# Patient Record
Sex: Female | Born: 1990 | Race: Black or African American | Hispanic: No | Smoking: Current every day smoker
Health system: Southern US, Community
[De-identification: ages and names within clinical notes are randomized; demographics above are authoritative.]

## PROBLEM LIST (undated history)

## (undated) DIAGNOSIS — N739 Female pelvic inflammatory disease, unspecified: Secondary | ICD-10-CM

## (undated) DIAGNOSIS — N83209 Unspecified ovarian cyst, unspecified side: Secondary | ICD-10-CM

## (undated) DIAGNOSIS — J45909 Unspecified asthma, uncomplicated: Secondary | ICD-10-CM

---
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
## 2008-12-09 ENCOUNTER — Other Ambulatory Visit: Admission: RE | Admit: 2008-12-09 | Discharge: 2008-12-09 | Payer: Self-pay | Admitting: Family Medicine

## 2010-07-10 NOTE — H&P (Signed)
NAMELUVERNE, Betty Roberts               ACCOUNT NO.:  [id number]   MEDICAL RECORD NO.:  [id number]          PATIENT TYPE:  INP   LOCATION:  9374                          FACILITY:  [facility]   PHYSICIAN:  Gerald Leitz, MD          DATE OF BIRTH:  06-27-1990   DATE OF ADMISSION:  11/11/2005  DATE OF DISCHARGE:                                HISTORY & PHYSICAL   HISTORY OF PRESENT ILLNESS:  This is a 20 year old G1, P0 at 15 weeks 6 days  estimated gestational age based on last menstrual period of July 23, 2005  confirmed by 6-week ultrasound with estimated date of delivery April 29, 2006.  Patient presents three-day history of vaginal bleeding. She had an  ultrasound in the office that showed fetal demise, crown-rump length of 11  weeks and 3 days with prominent spine, no fetal heart rate was seen. No  amniotic fluid in the gestational sac.  Placenta is posterior.  She denies  any abdominal pain.   PAST MEDICAL HISTORY:  Asthma.   PAST SURGICAL HISTORY:  None.   PAST GYN HISTORY:  Trichomonas treated in 2005.  She had a Pap smear in  December of 2006 and this was normal.   FAMILY HISTORY:  Noncontributory.   PAST OB HISTORY:  Current.   MEDICATIONS:  Prenatal vitamins.   ALLERGIES:  NO KNOWN DRUG ALLERGIES.   SOCIAL HISTORY:  The patient is single.  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  Denies tobacco, alcohol or illicit  drug use.   REVIEW OF SYSTEMS:  Negative except as stated in history of current illness.   PHYSICAL EXAMINATION:  VITAL SIGNS:  Blood pressure 110/60, weight 127  pounds.  CARDIOVASCULAR:  Regular rate and rhythm.  LUNGS:  Clear to auscultation bilaterally.  ABDOMEN:  Soft, nontender, nondistended, no masses.  EXTREMITIES:  No cyanosis, clubbing or edema, 2+ reflexes.  PELVIC:  Normal external female genitalia, no vulvar, vaginal or cervical  lesions noted.  No blood in the vaginal vault.  The cervix is closed.  No  cervical motion  tenderness.  Uterus seems approximately 12 to 14 weeks size.  No adnexal masses or tenderness.   IMPRESSION AND PLAN:  Second trimester fetal demise.  Recommend Cytotec  induction.  The patient is scheduled at [facility] on November 11, 2005.   PLAN:  Cytotec 800 mcg per vagina every eight hours until delivery.  Morphine sulfate for pain control.      Gerald Leitz, MD  Electronically Signed     TC/MEDQ  D:  11/10/2005  T:  11/11/2005  Job:  [id number]

## 2010-07-10 NOTE — Discharge Summary (Signed)
NAMEBRINLY, Betty Roberts               ACCOUNT NO.:  [id number]   MEDICAL RECORD NO.:  [id number]          PATIENT TYPE:  INP   LOCATION:  9316                          [facility]ILITY:  [facility]   PHYSICIAN:  Gerald Leitz, MD          DATE OF BIRTH:  1990-12-12   DATE OF ADMISSION:  11/11/2005  DATE OF DISCHARGE:  11/12/2005                                 DISCHARGE SUMMARY   REASON FOR ADMISSION:  Second trimester fetal demise for Cytotec induction.   BRIEF HOSPITAL COURSE:  This is a 20 year old G1 who presented to my office  on September 19 at 16 weeks estimated gestational age with a fetal demise.  She was admitted to maternity ICU for delivery, received Cytotec 800 mcg per  vagina, hemoglobin on admission was 12.9.  The patient had minimal bleeding  during the procedure, did well, vital signs remained stable.  On postpartum  day one, she denied any pain, no heavy bleeding, decreased lochia.  She was  discharged  home in improved and stable condition.  Diet will be regular.  She will follow up with Dr. Richardson Dopp in two weeks.  Medications at discharge are  Lotrin and Methergine.  Please note that the patient delivered on September  20 at approximately 11:45, placenta delivered at approximately 1630 on  September 20.  The patient did well postpartum.      Gerald Leitz, MD  Electronically Signed     TC/MEDQ  D:  11/12/2005  T:  11/14/2005  Job:  [id number]

## 2010-11-13 LAB — I-STAT 8, (EC8 V) (CONVERTED LAB)
Acid-base deficit: 2
BUN: 17
Bicarbonate: 23.7
Chloride: 107
Glucose, Bld: 108 — ABNORMAL HIGH
HCT: 47 — ABNORMAL HIGH
Hemoglobin: 16 — ABNORMAL HIGH
Operator id: 288331
Potassium: 4.2
Sodium: 139
TCO2: 25
pCO2, Ven: 41.6 — ABNORMAL LOW
pH, Ven: 7.364 — ABNORMAL HIGH

## 2010-11-13 LAB — URINALYSIS, ROUTINE W REFLEX MICROSCOPIC
Bilirubin Urine: NEGATIVE
Leukocytes, UA: NEGATIVE
Nitrite: NEGATIVE
Specific Gravity, Urine: 1.024
pH: 6

## 2010-11-13 LAB — POCT I-STAT CREATININE
Creatinine, Ser: 1.1
Operator id: 288331

## 2010-11-13 LAB — URINE MICROSCOPIC-ADD ON

## 2011-12-03 ENCOUNTER — Other Ambulatory Visit (HOSPITAL_COMMUNITY)
Admission: RE | Admit: 2011-12-03 | Discharge: 2011-12-03 | Disposition: A | Discharge status: Home / Self Care | Payer: Self-pay | Source: Ambulatory Visit | Attending: Family Medicine | Admitting: Family Medicine

## 2011-12-03 ENCOUNTER — Other Ambulatory Visit: Payer: Self-pay | Admitting: Physician Assistant

## 2011-12-03 DIAGNOSIS — Z01419 Encounter for gynecological examination (general) (routine) without abnormal findings: Secondary | ICD-10-CM | POA: Insufficient documentation

## 2013-08-01 ENCOUNTER — Encounter (HOSPITAL_BASED_OUTPATIENT_CLINIC_OR_DEPARTMENT_OTHER): Payer: Self-pay | Admitting: Emergency Medicine

## 2013-08-01 ENCOUNTER — Inpatient Hospital Stay (HOSPITAL_BASED_OUTPATIENT_CLINIC_OR_DEPARTMENT_OTHER)
Admission: EM | Admit: 2013-08-01 | Discharge: 2013-08-02 | Disposition: A | Discharge status: Other Acute Inpatient Hospital | Payer: BC Managed Care – PPO | Attending: Obstetrics and Gynecology | Admitting: Obstetrics and Gynecology

## 2013-08-01 DIAGNOSIS — J45909 Unspecified asthma, uncomplicated: Secondary | ICD-10-CM | POA: Insufficient documentation

## 2013-08-01 DIAGNOSIS — Z3202 Encounter for pregnancy test, result negative: Secondary | ICD-10-CM | POA: Insufficient documentation

## 2013-08-01 DIAGNOSIS — N730 Acute parametritis and pelvic cellulitis: Secondary | ICD-10-CM

## 2013-08-01 DIAGNOSIS — N9489 Other specified conditions associated with female genital organs and menstrual cycle: Secondary | ICD-10-CM

## 2013-08-01 DIAGNOSIS — F17200 Nicotine dependence, unspecified, uncomplicated: Secondary | ICD-10-CM | POA: Insufficient documentation

## 2013-08-01 DIAGNOSIS — R110 Nausea: Secondary | ICD-10-CM | POA: Insufficient documentation

## 2013-08-01 HISTORY — DX: Unspecified asthma, uncomplicated: J45.909

## 2013-08-01 LAB — LIPASE, BLOOD: LIPASE: 12 U/L (ref 11–59)

## 2013-08-01 LAB — WET PREP, GENITAL
Clue Cells Wet Prep HPF POC: NONE SEEN
TRICH WET PREP: NONE SEEN
Yeast Wet Prep HPF POC: NONE SEEN

## 2013-08-01 LAB — CBC WITH DIFFERENTIAL/PLATELET
BASOS ABS: 0 10*3/uL (ref 0.0–0.1)
Basophils Relative: 0 % (ref 0–1)
Eosinophils Absolute: 0 10*3/uL (ref 0.0–0.7)
Eosinophils Relative: 0 % (ref 0–5)
HCT: 35.4 % — ABNORMAL LOW (ref 36.0–46.0)
Hemoglobin: 12.5 g/dL (ref 12.0–15.0)
LYMPHS ABS: 1.3 10*3/uL (ref 0.7–4.0)
LYMPHS PCT: 12 % (ref 12–46)
MCH: 33.1 pg (ref 26.0–34.0)
MCHC: 35.3 g/dL (ref 30.0–36.0)
MCV: 93.7 fL (ref 78.0–100.0)
Monocytes Absolute: 0.7 10*3/uL (ref 0.1–1.0)
Monocytes Relative: 7 % (ref 3–12)
NEUTROS ABS: 9.1 10*3/uL — AB (ref 1.7–7.7)
NEUTROS PCT: 82 % — AB (ref 43–77)
PLATELETS: 200 10*3/uL (ref 150–400)
RBC: 3.78 MIL/uL — AB (ref 3.87–5.11)
RDW: 12.3 % (ref 11.5–15.5)
WBC: 11.2 10*3/uL — AB (ref 4.0–10.5)

## 2013-08-01 LAB — COMPREHENSIVE METABOLIC PANEL
ALBUMIN: 4.3 g/dL (ref 3.5–5.2)
ALK PHOS: 67 U/L (ref 39–117)
ALT: 7 U/L (ref 0–35)
AST: 15 U/L (ref 0–37)
BILIRUBIN TOTAL: 1.1 mg/dL (ref 0.3–1.2)
BUN: 19 mg/dL (ref 6–23)
CHLORIDE: 104 meq/L (ref 96–112)
CO2: 25 meq/L (ref 19–32)
CREATININE: 0.7 mg/dL (ref 0.50–1.10)
Calcium: 9.6 mg/dL (ref 8.4–10.5)
GFR calc Af Amer: >90 mL/min (ref >90)
GFR calc non Af Amer: >90 mL/min (ref >90)
Glucose, Bld: 110 mg/dL — ABNORMAL HIGH (ref 70–99)
POTASSIUM: 3.5 meq/L — AB (ref 3.7–5.3)
SODIUM: 141 meq/L (ref 137–147)
Total Protein: 7 g/dL (ref 6.0–8.3)

## 2013-08-01 LAB — URINALYSIS, ROUTINE W REFLEX MICROSCOPIC
Glucose, UA: NEGATIVE mg/dL
HGB URINE DIPSTICK: NEGATIVE
Ketones, ur: >80 mg/dL — AB
Leukocytes, UA: NEGATIVE
NITRITE: NEGATIVE
PH: 6 (ref 5.0–8.0)
Protein, ur: NEGATIVE mg/dL
SPECIFIC GRAVITY, URINE: 1.038 — AB (ref 1.005–1.030)
UROBILINOGEN UA: 1 mg/dL (ref 0.0–1.0)

## 2013-08-01 LAB — PREGNANCY, URINE: PREG TEST UR: NEGATIVE

## 2013-08-01 MED ORDER — SODIUM CHLORIDE 0.9 % IV BOLUS (SEPSIS)
1000.0000 mL | Freq: Once | INTRAVENOUS | Status: AC
  Administered 2013-08-01: 1000 mL via INTRAVENOUS

## 2013-08-01 MED ORDER — ONDANSETRON HCL 4 MG/2ML IJ SOLN
4.0000 mg | Freq: Once | INTRAMUSCULAR | Status: AC
  Administered 2013-08-01: 4 mg via INTRAVENOUS
  Filled 2013-08-01: qty 2

## 2013-08-01 MED ORDER — FENTANYL CITRATE 0.05 MG/ML IJ SOLN
50.0000 ug | Freq: Once | INTRAMUSCULAR | Status: AC
  Administered 2013-08-01: 50 ug via INTRAVENOUS
  Filled 2013-08-01: qty 2

## 2013-08-01 MED ORDER — SODIUM CHLORIDE 0.9 % IV SOLN: INTRAVENOUS | Status: DC

## 2013-08-01 NOTE — ED Provider Notes (Addendum)
CSN: [id number]     Arrival date & time 08/01/13  2035 History  This chart was scribed for Hanley Seamen, MD by Evon Slack, ED Scribe. This patient was seen in room MH04/MH04 and the patient's care was started at 10:55 PM.    Chief Complaint  Patient presents with  . Abdominal Pain   Patient is a 23 y.o. female presenting with abdominal pain. The history is provided by the patient. No language interpreter was used.  Abdominal Pain Associated symptoms: nausea   Associated symptoms: no chills, no diarrhea, no dysuria, no fever, no vaginal discharge and no vomiting   HPI Comments: Betty Roberts is a 23 y.o. female who presents to the Emergency Department complaining of abdominal pain onset 8AM today. She states that the pain is cramping and diffuse and has been radiating into her chest area. The pain waxes and wanes but is never gone. It is worse with movement or palpation. It is moderate to severe at its worst. She has associated nausea. She states that she has been taking tylenol with no relief of her symptoms. She denies fever, chills, vomiting, dysuria, vaginal discharge, or vaginal pain.    Past Medical History  Diagnosis Date  . Asthma    History reviewed. No pertinent past surgical history. History reviewed. No pertinent family history. History  Substance Use Topics  . Smoking status: Current Every Day Smoker    Types: Cigars  . Smokeless tobacco: Not on file  . Alcohol Use: No   OB History   Grav Para Term Preterm Abortions TAB SAB Ect Mult Living                 Review of Systems  Constitutional: Negative for fever and chills.  Gastrointestinal: Positive for nausea and abdominal pain. Negative for vomiting and diarrhea.  Genitourinary: Negative for dysuria, vaginal discharge and vaginal pain.   Allergies  Review of patient's allergies indicates no known allergies.  Home Medications   Prior to Admission medications   Not on File   Triage Vitals: BP 106/65   Pulse 110  Temp(Src) 98.2 F (36.8 C) (Oral)  Resp 16  Ht 5\' 10"  (1.778 m)  Wt 117 lb (53.071 kg)  BMI 16.79 kg/m2  SpO2 100%  LMP 07/11/2013  Physical Exam  Nursing note and vitals reviewed. General: Well-developed, well-nourished female in no acute distress; appearance consistent with age of record HENT: normocephalic; atraumatic Eyes: pupils equal, round and reactive to light; extraocular muscles intact Neck: supple Heart: regular rate and rhythm; no murmurs, rubs or gallops Lungs: clear to auscultation bilaterally Abdomen: soft; nondistended; diffusely tender; no masses or hepatosplenomegaly; bowel sounds present GU: No CVA tenderness; normal external genitalia; physiologic appearing vaginal discharge; no vaginal bleeding; no cervical erythema; mild cervical motion tenderness; mild bilateral adnexal tenderness Extremities: No deformity; full range of motion; pulses normal Neurologic: Awake, alert and oriented; motor function intact in all extremities and symmetric; no facial droop Skin: Warm and dry Psychiatric: Normal mood and affect   ED Course  Procedures (including critical care time) DIAGNOSTIC STUDIES: Oxygen Saturation is 100% on RA, normal by my interpretation.    COORDINATION OF CARE: 11:02 PM-Discussed treatment plan which includes CBC panel, CMP, and UA with pt at bedside and pt agreed to plan.     MDM   Nursing notes and vitals signs, including pulse oximetry, reviewed.  Summary of this visit's results, reviewed by myself:  Labs:  Results for orders placed during the hospital  encounter of 08/01/13 (from the past 24 hour(s))  URINALYSIS, ROUTINE W REFLEX MICROSCOPIC     Status: Abnormal   Collection Time    08/01/13  8:52 PM      Result Value Ref Range   Color, Urine YELLOW  YELLOW   APPearance CLOUDY (*) CLEAR   Specific Gravity, Urine 1.038 (*) 1.005 - 1.030   pH 6.0  5.0 - 8.0   Glucose, UA NEGATIVE  NEGATIVE mg/dL   Hgb urine dipstick NEGATIVE   NEGATIVE   Bilirubin Urine SMALL (*) NEGATIVE   Ketones, ur >80 (*) NEGATIVE mg/dL   Protein, ur NEGATIVE  NEGATIVE mg/dL   Urobilinogen, UA 1.0  0.0 - 1.0 mg/dL   Nitrite NEGATIVE  NEGATIVE   Leukocytes, UA NEGATIVE  NEGATIVE  PREGNANCY, URINE     Status: None   Collection Time    08/01/13  8:52 PM      Result Value Ref Range   Preg Test, Ur NEGATIVE  NEGATIVE  CBC WITH DIFFERENTIAL     Status: Abnormal   Collection Time    08/01/13 11:16 PM      Result Value Ref Range   WBC 11.2 (*) 4.0 - 10.5 K/uL   RBC 3.78 (*) 3.87 - 5.11 MIL/uL   Hemoglobin 12.5  12.0 - 15.0 g/dL   HCT 16.135.4 (*) 09.636.0 - 04.546.0 %   MCV 93.7  78.0 - 100.0 fL   MCH 33.1  26.0 - 34.0 pg   MCHC 35.3  30.0 - 36.0 g/dL   RDW 40.912.3  81.111.5 - 91.415.5 %   Platelets 200  150 - 400 K/uL   Neutrophils Relative % 82 (*) 43 - 77 %   Neutro Abs 9.1 (*) 1.7 - 7.7 K/uL   Lymphocytes Relative 12  12 - 46 %   Lymphs Abs 1.3  0.7 - 4.0 K/uL   Monocytes Relative 7  3 - 12 %   Monocytes Absolute 0.7  0.1 - 1.0 K/uL   Eosinophils Relative 0  0 - 5 %   Eosinophils Absolute 0.0  0.0 - 0.7 K/uL   Basophils Relative 0  0 - 1 %   Basophils Absolute 0.0  0.0 - 0.1 K/uL  LIPASE, BLOOD     Status: None   Collection Time    08/01/13 11:16 PM      Result Value Ref Range   Lipase 12  11 - 59 U/L  COMPREHENSIVE METABOLIC PANEL     Status: Abnormal   Collection Time    08/01/13 11:16 PM      Result Value Ref Range   Sodium 141  137 - 147 mEq/L   Potassium 3.5 (*) 3.7 - 5.3 mEq/L   Chloride 104  96 - 112 mEq/L   CO2 25  19 - 32 mEq/L   Glucose, Bld 110 (*) 70 - 99 mg/dL   BUN 19  6 - 23 mg/dL   Creatinine, Ser 7.820.70  0.50 - 1.10 mg/dL   Calcium 9.6  8.4 - 95.610.5 mg/dL   Total Protein 7.0  6.0 - 8.3 g/dL   Albumin 4.3  3.5 - 5.2 g/dL   AST 15  0 - 37 U/L   ALT 7  0 - 35 U/L   Alkaline Phosphatase 67  39 - 117 U/L   Total Bilirubin 1.1  0.3 - 1.2 mg/dL   GFR calc non Af Amer >90  >90 mL/min   GFR calc Af Amer >90  >90 mL/min  WET PREP,  GENITAL     Status: Abnormal   Collection Time    08/01/13 11:35 PM      Result Value Ref Range   Yeast Wet Prep HPF POC NONE SEEN  NONE SEEN   Trich, Wet Prep NONE SEEN  NONE SEEN   Clue Cells Wet Prep HPF POC NONE SEEN  NONE SEEN   WBC, Wet Prep HPF POC FEW (*) NONE SEEN    Imaging Studies: Ct Abdomen Pelvis W Contrast  08/02/2013   CLINICAL DATA:  Stabbing abdominal pain, pelvic pain, nausea.  EXAM: CT ABDOMEN AND PELVIS WITH CONTRAST  TECHNIQUE: Multidetector CT imaging of the abdomen and pelvis was performed using the standard protocol following bolus administration of intravenous contrast.  CONTRAST:  50mL OMNIPAQUE IOHEXOL 300 MG/ML SOLN, OMNIPAQUE IOHEXOL 300 MG/ML SOLN  COMPARISON:  None.  FINDINGS: There is diffuse free fluid throughout the abdomen and pelvis. Increased Hounsfield unit measurements suggest complex fluid. This could represent hemorrhage or infected fluid. Probable heterogeneous soft tissue mass with involuting follicle in the right ovary. Soft tissue component appears to measure about 5.6 x 5.3 cm. Consider ectopic pregnancy versus is ruptured ovarian mass. Ultrasound suggested for further evaluation. Correlation with pregnancy test is suggested if not already obtained.  The liver, spleen, gallbladder, pancreas, adrenal glands, kidneys, abdominal aorta, inferior vena cava, and retroperitoneal lymph nodes are unremarkable. Stomach and small bowel are not abnormally distended. Stool-filled colon without distention. No free air in the abdomen.  Pelvis: Bladder wall is not thickened. Uterus does not appear enlarged. Left ovary is not enlarged. Appendix is not identified. No destructive bone lesions appreciated.  IMPRESSION: Diffuse free fluid throughout the abdomen and pelvis suggesting hemorrhage. There appears to be a large soft tissue mass in the right adnexa with additional involuting follicle. Consider rupturing ovarian mass versus ectopic pregnancy. Ultrasound  recommended.  Results discussed with Dr. Read Drivers at 0208 hours on 08/02/2013.   Electronically Signed   By: Burman Nieves M.D.   On: 08/02/2013 02:11   2:35 AM Discussed with nurse midwife Sharen Counter at [facility] MAU who will see patient. Patient has been stable and has been informed of the CT findings. She is requesting some additional pain medication prior to transfer.  I personally performed the services described in this documentation, which was scribed in my presence. The recorded information has been reviewed and is accurate.    Hanley Seamen, MD 08/02/13 0235  Hanley Seamen, MD 08/02/13 [phone]

## 2013-08-01 NOTE — ED Notes (Signed)
C/o lower abd pain w some nausea x 1 day,  Denies urinary sx

## 2013-08-01 NOTE — ED Notes (Signed)
Pt c/o lower abd pain with nausea only x 1 day

## 2013-08-02 ENCOUNTER — Emergency Department (HOSPITAL_BASED_OUTPATIENT_CLINIC_OR_DEPARTMENT_OTHER): Payer: BC Managed Care – PPO

## 2013-08-02 ENCOUNTER — Inpatient Hospital Stay (HOSPITAL_COMMUNITY): Payer: BC Managed Care – PPO

## 2013-08-02 ENCOUNTER — Encounter (HOSPITAL_COMMUNITY): Payer: Self-pay | Admitting: *Deleted

## 2013-08-02 DIAGNOSIS — N9489 Other specified conditions associated with female genital organs and menstrual cycle: Secondary | ICD-10-CM

## 2013-08-02 LAB — GC/CHLAMYDIA PROBE AMP
CT PROBE, AMP APTIMA: NEGATIVE
GC PROBE AMP APTIMA: NEGATIVE

## 2013-08-02 MED ORDER — DOXYCYCLINE HYCLATE 100 MG PO CAPS: 100.0000 mg | ORAL_CAPSULE | Freq: Two times a day (BID) | ORAL | Status: DC

## 2013-08-02 MED ORDER — OXYCODONE-ACETAMINOPHEN 5-325 MG PO TABS: 1.0000 | ORAL_TABLET | Freq: Four times a day (QID) | ORAL | Status: DC | PRN

## 2013-08-02 MED ORDER — IOHEXOL 300 MG/ML  SOLN
50.0000 mL | Freq: Once | INTRAMUSCULAR | Status: AC | PRN
  Administered 2013-08-02: 50 mL via ORAL

## 2013-08-02 MED ORDER — OXYCODONE-ACETAMINOPHEN 5-325 MG PO TABS
2.0000 | ORAL_TABLET | ORAL | Status: AC
  Administered 2013-08-02: 2 via ORAL
  Filled 2013-08-02: qty 2

## 2013-08-02 MED ORDER — FENTANYL CITRATE 0.05 MG/ML IJ SOLN
50.0000 ug | Freq: Once | INTRAMUSCULAR | Status: AC
  Administered 2013-08-02: 50 ug via INTRAVENOUS
  Filled 2013-08-02: qty 2

## 2013-08-02 MED ORDER — CEFTRIAXONE SODIUM 250 MG IJ SOLR
250.0000 mg | INTRAMUSCULAR | Status: AC
  Administered 2013-08-02: 250 mg via INTRAMUSCULAR
  Filled 2013-08-02: qty 250

## 2013-08-02 MED ORDER — IOHEXOL 300 MG/ML  SOLN
100.0000 mL | Freq: Once | INTRAMUSCULAR | Status: AC | PRN
  Administered 2013-08-02: 100 mL via INTRAVENOUS

## 2013-08-02 MED ORDER — AZITHROMYCIN 250 MG PO TABS
1000.0000 mg | ORAL_TABLET | ORAL | Status: AC
  Administered 2013-08-02: 1000 mg via ORAL
  Filled 2013-08-02: qty 4

## 2013-08-02 NOTE — ED Notes (Signed)
Per md pt [facility]  By pov w parent for [place],  Iv intact and secure

## 2013-08-02 NOTE — MAU Provider Note (Signed)
Attestation of Attending Supervision of Advanced Practitioner: Evaluation and management procedures were performed by the PA/NP/CNM/OB Fellow under my supervision/collaboration. Chart reviewed and agree with management and plan.  Arieh Bogue V 08/02/2013 12:11 PM

## 2013-08-02 NOTE — MAU Provider Note (Signed)
Chief Complaint: Abdominal Pain   None    SUBJECTIVE HPI: Betty Roberts is a 23 y.o. who presents to maternity admissions sent from [facility] for diffuse abdominal pain.  She reports onset of abdominal cramping this morning when she was getting ready for work.  The pain worsened over the next few hours and she went to [facility] to be evaluated.  Her CT scan today showed a pelvic mass and complex fluid in her abdomen.  She was sent to MAU for pelvic ultrasound.  She denies vaginal bleeding, vaginal itching/burning, urinary symptoms, h/a, dizziness, n/v, or fever/chills.     Past Medical History  Diagnosis Date  . Asthma    History reviewed. No pertinent past surgical history. History   Social History  . Marital Status: Single    Spouse Name: N/A    Number of Children: N/A  . Years of Education: N/A   Occupational History  . Not on file.   Social History Main Topics  . Smoking status: Current Every Day Smoker    Types: Cigars  . Smokeless tobacco: Not on file  . Alcohol Use: No  . Drug Use: No  . Sexual Activity: Yes    Birth Control/ Protection: None   Other Topics Concern  . Not on file   Social History Narrative  . No narrative on file   No current facility-administered medications on file prior to encounter.   No current outpatient prescriptions on file prior to encounter.   No Known Allergies  ROS: Pertinent items in HPI  OBJECTIVE Blood pressure 116/59, pulse 83, temperature 98.3 F (36.8 C), temperature source Oral, resp. rate 18, height 5' 10.6" (1.793 m), weight 55.339 kg (122 lb), last menstrual period 07/10/2013, SpO2 100.00%. GENERAL: Well-developed, well-nourished female in no acute distress.  HEENT: Normocephalic HEART: normal rate RESP: normal effort ABDOMEN: Soft, non-tender EXTREMITIES: Nontender, no edema NEURO: Alert and oriented SPECULUM EXAM: Deferred--done at [facility] with cultures sent  LAB  RESULTS Results for orders placed during the hospital encounter of 08/01/13 (from the past 24 hour(s))  URINALYSIS, ROUTINE W REFLEX MICROSCOPIC     Status: Abnormal   Collection Time    08/01/13  8:52 PM      Result Value Ref Range   Color, Urine YELLOW  YELLOW   APPearance CLOUDY (*) CLEAR   Specific Gravity, Urine 1.038 (*) 1.005 - 1.030   pH 6.0  5.0 - 8.0   Glucose, UA NEGATIVE  NEGATIVE mg/dL   Hgb urine dipstick NEGATIVE  NEGATIVE   Bilirubin Urine SMALL (*) NEGATIVE   Ketones, ur >80 (*) NEGATIVE mg/dL   Protein, ur NEGATIVE  NEGATIVE mg/dL   Urobilinogen, UA 1.0  0.0 - 1.0 mg/dL   Nitrite NEGATIVE  NEGATIVE   Leukocytes, UA NEGATIVE  NEGATIVE  PREGNANCY, URINE     Status: None   Collection Time    08/01/13  8:52 PM      Result Value Ref Range   Preg Test, Ur NEGATIVE  NEGATIVE  CBC WITH DIFFERENTIAL     Status: Abnormal   Collection Time    08/01/13 11:16 PM      Result Value Ref Range   WBC 11.2 (*) 4.0 - 10.5 K/uL   RBC 3.78 (*) 3.87 - 5.11 MIL/uL   Hemoglobin 12.5  12.0 - 15.0 g/dL   HCT 75.8 (*) 83.2 - 54.9 %   MCV 93.7  78.0 - 100.0 fL   MCH 33.1  26.0 - 34.0 pg   MCHC 35.3  30.0 - 36.0 g/dL   RDW 16.1  09.6 - 04.5 %   Platelets 200  150 - 400 K/uL   Neutrophils Relative % 82 (*) 43 - 77 %   Neutro Abs 9.1 (*) 1.7 - 7.7 K/uL   Lymphocytes Relative 12  12 - 46 %   Lymphs Abs 1.3  0.7 - 4.0 K/uL   Monocytes Relative 7  3 - 12 %   Monocytes Absolute 0.7  0.1 - 1.0 K/uL   Eosinophils Relative 0  0 - 5 %   Eosinophils Absolute 0.0  0.0 - 0.7 K/uL   Basophils Relative 0  0 - 1 %   Basophils Absolute 0.0  0.0 - 0.1 K/uL  LIPASE, BLOOD     Status: None   Collection Time    08/01/13 11:16 PM      Result Value Ref Range   Lipase 12  11 - 59 U/L  COMPREHENSIVE METABOLIC PANEL     Status: Abnormal   Collection Time    08/01/13 11:16 PM      Result Value Ref Range   Sodium 141  137 - 147 mEq/L   Potassium 3.5 (*) 3.7 - 5.3 mEq/L   Chloride 104  96 - 112 mEq/L    CO2 25  19 - 32 mEq/L   Glucose, Bld 110 (*) 70 - 99 mg/dL   BUN 19  6 - 23 mg/dL   Creatinine, Ser 4.09  0.50 - 1.10 mg/dL   Calcium 9.6  8.4 - 81.1 mg/dL   Total Protein 7.0  6.0 - 8.3 g/dL   Albumin 4.3  3.5 - 5.2 g/dL   AST 15  0 - 37 U/L   ALT 7  0 - 35 U/L   Alkaline Phosphatase 67  39 - 117 U/L   Total Bilirubin 1.1  0.3 - 1.2 mg/dL   GFR calc non Af Amer >90  >90 mL/min   GFR calc Af Amer >90  >90 mL/min  WET PREP, GENITAL     Status: Abnormal   Collection Time    08/01/13 11:35 PM      Result Value Ref Range   Yeast Wet Prep HPF POC NONE SEEN  NONE SEEN   Trich, Wet Prep NONE SEEN  NONE SEEN   Clue Cells Wet Prep HPF POC NONE SEEN  NONE SEEN   WBC, Wet Prep HPF POC FEW (*) NONE SEEN    IMAGING  US Transvaginal Non-ob  08/02/2013   CLINICAL DATA:  Diffuse abdominal pain. Cystic mass and fluid in the pelvis on CT.  EXAM: TRANSABDOMINAL AND TRANSVAGINAL ULTRASOUND OF PELVIS  TECHNIQUE: Both transabdominal and transvaginal ultrasound examinations of the pelvis were performed. Transabdominal technique was performed for global imaging of the pelvis including uterus, ovaries, adnexal regions, and pelvic cul-de-sac. It was necessary to proceed with endovaginal exam following the transabdominal exam to visualize the uterus and ovaries.  COMPARISON:  CT abdomen and pelvis 08/02/2013  FINDINGS: Uterus  Measurements: 8.8 x 4.9 x 5 cm, retroverted. No fibroids or other mass visualized.  Endometrium  Thickness: 11 mm.  No focal abnormality visualized.  Right ovary  Measurements: 5.8 x 3.2 x 3.2 cm. Normal appearance to the right ovary. In the right adnexal region, there is complex appearing solid and cystic mass measuring 5.4 x 4.9 x 4.3 cm. This may be related to the tubes.  Left ovary  Measurements: 5 x 3.3 x  2.4 cm. Normal appearance to the left ovary. Adjacent to the left ovary, there is a complex cystic and solid mass measuring 4.6 x 2.3 x 2.3 cm. This may be related to the fallopian  tubes.  Other findings  Large amount of free fluid in the pelvis.  IMPRESSION: Large amount of free fluid in the pelvis. The ovaries appear normal but in the adnexal regions bilaterally, complex mass lesions are identified which may be affiliated with the fallopian tubes. Consider endometriosis or pelvic inflammatory disease. Atypical hemorrhagic cyst would be less likely. Short-term follow-up is suggested.   Electronically Signed   By: Burman NievesWilliam  Stevens M.D.   On: 08/02/2013 04:12   Ct Abdomen Pelvis W Contrast  08/02/2013   CLINICAL DATA:  Stabbing abdominal pain, pelvic pain, nausea.  EXAM: CT ABDOMEN AND PELVIS WITH CONTRAST  TECHNIQUE: Multidetector CT imaging of the abdomen and pelvis was performed using the standard protocol following bolus administration of intravenous contrast.  CONTRAST:  50mL OMNIPAQUE IOHEXOL 300 MG/ML SOLN, 100mL OMNIPAQUE IOHEXOL 300 MG/ML SOLN  COMPARISON:  None.  FINDINGS: There is diffuse free fluid throughout the abdomen and pelvis. Increased Hounsfield unit measurements suggest complex fluid. This could represent hemorrhage or infected fluid. Probable heterogeneous soft tissue mass with involuting follicle in the right ovary. Soft tissue component appears to measure about 5.6 x 5.3 cm. Consider ectopic pregnancy versus is ruptured ovarian mass. Ultrasound suggested for further evaluation. Correlation with pregnancy test is suggested if not already obtained.  The liver, spleen, gallbladder, pancreas, adrenal glands, kidneys, abdominal aorta, inferior vena cava, and retroperitoneal lymph nodes are unremarkable. Stomach and small bowel are not abnormally distended. Stool-filled colon without distention. No free air in the abdomen.  Pelvis: Bladder wall is not thickened. Uterus does not appear enlarged. Left ovary is not enlarged. Appendix is not identified. No destructive bone lesions appreciated.  IMPRESSION: Diffuse free fluid throughout the abdomen and pelvis suggesting  hemorrhage. There appears to be a large soft tissue mass in the right adnexa with additional involuting follicle. Consider rupturing ovarian mass versus ectopic pregnancy. Ultrasound recommended.  Results discussed with Dr. Read DriversMolpus at 0208 hours on 08/02/2013.   Electronically Signed   By: Burman NievesWilliam  Stevens M.D.   On: 08/02/2013 02:11    ASSESSMENT 1. Adnexal mass   2. PID (acute pelvic inflammatory disease)     PLAN Rocephin 250 mg IM, azithromycin 1g PO, Percocet 5/325 x2 tabs in MAU Discharge home Doxycycline 100 mg BID x14 days Percocet 5/325, take 1-2 tabs Q 6 hours PRN x 20 tabs F/U appointment next week in [facility]   Follow-up Information   [facility] (On Monday, 08/06/13, at 3:00 pm. )    Specialty:  Obstetrics and Gynecology   Contact information:   [address] [place] [address] [phone]      Follow up with [facility]. (As needed for emergencies)    Contact information:   [address] [id number] [place] [address] [phone]      Sharen CounterLisa Leftwich-Kirby Certified Nurse-Midwife 08/02/2013  6:04 AM

## 2013-08-02 NOTE — Discharge Instructions (Signed)
Pelvic Inflammatory Disease  Pelvic inflammatory disease (PID) refers to an infection in some or all of the female organs. The infection can be in the uterus, ovaries, fallopian tubes, or the surrounding tissues in the pelvis. PID can cause abdominal or pelvic pain that comes on suddenly (acute pelvic pain). PID is a serious infection because it can lead to lasting (chronic) pelvic pain or the inability to have children (infertile).   CAUSES   The infection is often caused by the normal bacteria found in the vaginal tissues. PID may also be caused by an infection that is spread during sexual contact. PID can also occur following:   · The birth of a baby.    · A miscarriage.    · An abortion.    · Major pelvic surgery.    · The use of an intrauterine device (IUD).    · A sexual assault.    RISK FACTORS  Certain factors can put a person at higher risk for PID, such as:  · Being younger than 25 years.  · Being sexually active at a young age.  · Using nonbarrier contraception.  · Having multiple sexual partners.  · Having sex with someone who has symptoms of a genital infection.  · Using oral contraception.  Other times, certain behaviors can increase the possibility of getting PID, such as:  · Having sex during your period.  · Using a vaginal douche.  · Having an intrauterine device (IUD) in place.  SYMPTOMS   · Abdominal or pelvic pain.    · Fever.    · Chills.    · Abnormal vaginal discharge.  · Abnormal uterine bleeding.    · Unusual pain shortly after finishing your period.  DIAGNOSIS   Your caregiver will choose some of the following methods to make a diagnosis, such as:   · Performing a physical exam and history. A pelvic exam typically reveals a very tender uterus and surrounding pelvis.    · Ordering laboratory tests including a pregnancy test, blood tests, and urine test.   · Ordering cultures of the vagina and cervix to check for a sexually transmitted infection (STI).  · Performing an ultrasound.     · Performing a laparoscopic procedure to look inside the pelvis.    TREATMENT   · Antibiotic medicines may be prescribed and taken by mouth.    · Sexual partners may be treated when the infection is caused by a sexually transmitted disease (STD).    · Hospitalization may be needed to give antibiotics intravenously.  · Surgery may be needed, but this is rare.  It may take weeks until you are completely well. If you are diagnosed with PID, you should also be checked for human immunodeficiency virus (HIV).    HOME CARE INSTRUCTIONS   · If given, take your antibiotics as directed. Finish the medicine even if you start to feel better.    · Only take over-the-counter or prescription medicines for pain, discomfort, or fever as directed by your caregiver.    · Do not have sexual intercourse until treatment is completed or as directed by your caregiver. If PID is confirmed, your recent sexual partner(s) will need treatment.    · Keep your follow-up appointments.  SEEK MEDICAL CARE IF:   · You have increased or abnormal vaginal discharge.    · You need prescription medicine for your pain.    · You vomit.    · You cannot take your medicines.    · Your partner has an STD.    SEEK IMMEDIATE MEDICAL CARE IF:   · You have a fever.    · You have increased abdominal or   pelvic pain.    · You have chills.    · You have pain when you urinate.    · You are not better after 72 hours following treatment.    MAKE SURE YOU:   · Understand these instructions.  · Will watch your condition.  · Will get help right away if you are not doing well or get worse.  Document Released: 02/08/2005 Document Revised: 06/05/2012 Document Reviewed: 02/04/2011  ExitCare® Patient Information ©2014 Exit[employer], LLC.

## 2013-08-02 NOTE — MAU Note (Signed)
Sent from [facility] for ultrasound. Not pregnant-abdominal pain and nausea. CT showed ruptured mass and fluid in abdomen.

## 2013-08-06 ENCOUNTER — Ambulatory Visit (INDEPENDENT_AMBULATORY_CARE_PROVIDER_SITE_OTHER): Payer: BC Managed Care – PPO | Admitting: Obstetrics & Gynecology

## 2013-08-06 ENCOUNTER — Encounter: Payer: Self-pay | Admitting: Obstetrics & Gynecology

## 2013-08-06 VITALS — BP 109/72 | HR 91 | Temp 98.3°F | Ht 70.0 in | Wt 121.3 lb

## 2013-08-06 DIAGNOSIS — R9389 Abnormal findings on diagnostic imaging of other specified body structures: Secondary | ICD-10-CM

## 2013-08-06 NOTE — Progress Notes (Signed)
   Subjective:    Patient ID: Betty Roberts, female    DOB: 1991-01-02, 23 y.o.   MRN: [id number]  HPI  23 yo S AA G1P0 here for follow up from MAU visit 08-02-13. She was diagnosed with was PID, had WBC of 11, u/s and CT c/w hydrosalpinx. She was given IM rocephin and po abx. She feels better, although she reports that she gets sharp pains only after eating.   Review of Systems  She is now in a same sex relationship.    Objective:   Physical Exam        Assessment & Plan:  Rec repeat u/s in 3 months

## 2013-08-07 ENCOUNTER — Inpatient Hospital Stay (HOSPITAL_COMMUNITY)
Admission: AD | Admit: 2013-08-07 | Discharge: 2013-08-07 | Disposition: A | Discharge status: Home / Self Care | Payer: BC Managed Care – PPO | Source: Ambulatory Visit | Attending: Obstetrics & Gynecology | Admitting: Obstetrics & Gynecology

## 2013-08-07 ENCOUNTER — Encounter (HOSPITAL_COMMUNITY): Payer: Self-pay | Admitting: *Deleted

## 2013-08-07 DIAGNOSIS — N83209 Unspecified ovarian cyst, unspecified side: Secondary | ICD-10-CM

## 2013-08-07 DIAGNOSIS — F17200 Nicotine dependence, unspecified, uncomplicated: Secondary | ICD-10-CM | POA: Insufficient documentation

## 2013-08-07 DIAGNOSIS — N739 Female pelvic inflammatory disease, unspecified: Secondary | ICD-10-CM | POA: Insufficient documentation

## 2013-08-07 DIAGNOSIS — N730 Acute parametritis and pelvic cellulitis: Secondary | ICD-10-CM

## 2013-08-07 DIAGNOSIS — R109 Unspecified abdominal pain: Secondary | ICD-10-CM | POA: Insufficient documentation

## 2013-08-07 HISTORY — DX: Unspecified ovarian cyst, unspecified side: N83.209

## 2013-08-07 HISTORY — DX: Female pelvic inflammatory disease, unspecified: N73.9

## 2013-08-07 LAB — URINALYSIS, ROUTINE W REFLEX MICROSCOPIC
BILIRUBIN URINE: NEGATIVE
Glucose, UA: NEGATIVE mg/dL
Ketones, ur: NEGATIVE mg/dL
LEUKOCYTES UA: NEGATIVE
NITRITE: NEGATIVE
Protein, ur: NEGATIVE mg/dL
SPECIFIC GRAVITY, URINE: 1.02 (ref 1.005–1.030)
UROBILINOGEN UA: 0.2 mg/dL (ref 0.0–1.0)
pH: 5.5 (ref 5.0–8.0)

## 2013-08-07 LAB — POCT PREGNANCY, URINE: Preg Test, Ur: NEGATIVE

## 2013-08-07 LAB — URINE MICROSCOPIC-ADD ON

## 2013-08-07 MED ORDER — ONDANSETRON HCL 4 MG PO TABS: 4.0000 mg | ORAL_TABLET | Freq: Four times a day (QID) | ORAL | Status: DC

## 2013-08-07 NOTE — MAU Note (Signed)
Was seen in MAU 6/10. States pain had subsided and now has returned. Still taking antibiotic. States stomach seems upset from that.

## 2013-08-07 NOTE — MAU Provider Note (Signed)
History     CSN: [id number]  Arrival date and time: 08/07/13 1147   [facility] T[facility] Initiated Contact with Patient 08/07/13 1243      Chief Complaint  Patient presents with  . Abdominal Pain   HPI  Betty Roberts is a [age] y/p G1P0010 black female who is presenting to the MAU with continued abdominal pain despite treatment initiation on  08/02/13 for her ruptured ovarian cysts and likely PID. Pt reports a continuous stabbing pain on both sides of her lower abdomen that radiates up her belly. Pt states that this pain is consistent with the pain she felt and was treated for last Thursday. She took a Percocet/Roxicet 5-325 mg tablet this morning at 11:30am that took her pain from an 8/10 to a 5/10. Pt also endorses nausea 2x/day which she associates to the Doxycycline. She has tried taking the antibiotic both with and without food. Pt denies fever, chills, vaginal bleeding or discharge, UTI symptoms, and lightheadedness. Pt endorses a headache x 4 days of random onset that has moderate relief with Tylenol. Patient denies worsening of pain today. She was seen for follow-up in [facility] and was scheduled for US follow-up in 3 months.   Past Medical History  Diagnosis Date  . Asthma   . Ovarian cyst   . Pelvic inflammatory disease     History reviewed. No pertinent past surgical history.  History reviewed. No pertinent family history.  History  Substance Use Topics  . Smoking status: Current Every Day Smoker    Types: Cigars  . Smokeless tobacco: Not on file  . Alcohol Use: No    Allergies: No Known Allergies  Prescriptions prior to admission  Medication Sig Dispense Refill  . doxycycline (VIBRAMYCIN) 100 MG capsule Take 1 capsule (100 mg total) by mouth 2 (two) times daily.  28 capsule  0  . oxyCODONE-acetaminophen (PERCOCET/ROXICET) 5-325 MG per tablet Take 1-2 tablets by mouth every 6 (six) hours as needed.  20 tablet  0    Review of Systems  Constitutional: Negative for fever and  chills.  Respiratory: Negative for shortness of breath.   Cardiovascular: Negative for chest pain.  Gastrointestinal: Positive for nausea and abdominal pain (8/10 in lower abdomen that radiates upward). Negative for heartburn, vomiting, diarrhea and constipation.  Genitourinary: Negative for dysuria, urgency, frequency and hematuria.       Negative for vaginal bleeding and discharge.  Neurological: Positive for headaches (mild x4 days). Negative for dizziness.   Physical Exam   Blood pressure 124/71, pulse 68, temperature 98.4 F (36.9 C), temperature source Oral, resp. rate 18, height 5\' 10"  (1.778 m), weight 55.52 kg (122 lb 6.4 oz), last menstrual period 07/10/2013.  Physical Exam  Constitutional: She is oriented to person, place, and time. She appears well-developed and well-nourished. No distress.  Cardiovascular: Normal rate, regular rhythm and normal heart sounds.   Respiratory: Effort normal and breath sounds normal.  GI: Soft. Bowel sounds are normal. She exhibits no distension. There is tenderness in the right lower quadrant, suprapubic area and left lower quadrant. There is no rebound and no guarding.  Neurological: She is alert and oriented to person, place, and time.  Psychiatric: She has a normal mood and affect. Her behavior is normal.   Results for orders placed during the hospital encounter of 08/07/13 (from the past 24 hour(s))  URINALYSIS, ROUTINE W REFLEX MICROSCOPIC     Status: Abnormal   Collection Time    08/07/13 12:18 PM  Result Value Ref Range   Color, Urine YELLOW  YELLOW   APPearance CLEAR  CLEAR   Specific Gravity, Urine 1.020  1.005 - 1.030   pH 5.5  5.0 - 8.0   Glucose, UA NEGATIVE  NEGATIVE mg/dL   Hgb urine dipstick SMALL (*) NEGATIVE   Bilirubin Urine NEGATIVE  NEGATIVE   Ketones, ur NEGATIVE  NEGATIVE mg/dL   Protein, ur NEGATIVE  NEGATIVE mg/dL   Urobilinogen, UA 0.2  0.0 - 1.0 mg/dL   Nitrite NEGATIVE  NEGATIVE   Leukocytes, UA NEGATIVE   NEGATIVE  URINE MICROSCOPIC-ADD ON     Status: Abnormal   Collection Time    08/07/13 12:18 PM      Result Value Ref Range   Squamous Epithelial / LPF MANY (*) RARE   WBC, UA 0-2  <3 WBC/hpf   RBC / HPF 3-6  <3 RBC/hpf   Bacteria, UA FEW (*) RARE   Urine-Other MUCOUS PRESENT    POCT PREGNANCY, URINE     Status: None   Collection Time    08/07/13 12:24 PM      Result Value Ref Range   Preg Test, Ur NEGATIVE  NEGATIVE    Bing PlumeGervasi, Kristin E 08/07/2013, 1:05 PM   MAU Course  Procedures None  MDM Discussed at length results of US and CT scan from previous visit. Discussed what patient should expect as far as appropriate pain for these conditions. Patient was given Rx for Zofran to help make antibiotics more tolerable. Patient advised to use ibuprofen PRN pain as well as the percocet as needed. The patient voiced understanding of the diagnosis, treatment and follow-up plan at this time. She will keep appointment for follow-up US 11/07/13 and call the [facility] for an appointment after that to discuss results and have annual exam.   Assessment and Plan  A: Presumed PID Ruptured ovarian cyst  P: Discharge home Rx for Zofran sent to patient's pharmacy Patient advised to increase PO hydration and use colace PRN to avoid constipation Patient advised to keep appointments for follow-up Patient may return to MAU as needed or if her condition were to change or worsen  Freddi StarrJulie N Ethier, PA-C 08/07/2013 3:13 PM

## 2013-08-07 NOTE — Discharge Instructions (Signed)
Ovarian Cyst An ovarian cyst is a sac filled with fluid or blood. This sac is attached to the ovary. Some cysts go away on their own. Other cysts need treatment.  HOME CARE   Only take medicine as told by your doctor.  Follow up with your doctor as told.  Get regular pelvic exams and Pap tests. GET HELP IF:  Your periods are late, not regular, or painful.  You stop having periods.  Your belly (abdominal) or pelvic pain does not go away.  Your belly becomes large or puffy (swollen).  You have a hard time peeing (totally emptying your bladder).  You have pressure on your bladder.  You have pain during sex.  You feel fullness, pressure, or discomfort in your belly.  You lose weight for no reason.  You feel sick most of the time.  You have a hard time pooping (constipation).  You do not feel like eating.  You develop pimples (acne).  You have an increase in hair on your body and face.  You are gaining weight for no reason.  You think you are pregnant. GET HELP RIGHT AWAY IF:   Your belly pain gets worse.  You feel sick to your stomach (nauseous), and you throw up (vomit).  You have a fever that comes on fast.  You have belly pain while pooping (bowel movement).  Your periods are heavier than usual. MAKE SURE YOU:   Understand these instructions.  Will watch your condition.  Will get help right away if you are not doing well or get worse. Document Released: 07/28/2007 Document Revised: 11/29/2012 Document Reviewed: 10/16/2012 [facility] Patient Information 2014 [place], [place]. Pelvic Inflammatory Disease Pelvic inflammatory disease (PID) is an infection in some or all of the female organs. PID can be in the uterus, ovaries, fallopian tubes, or the surrounding tissues inside the lower belly area (pelvis). HOME CARE   If given, take your antibiotic medicine as told. Finish them even if you start to feel better.  Only take medicine as told by your  doctor.  Do not have sex (intercourse) until treatment is done or as told by your doctor.  Tell your sex partner if you have PID. Your partner may need to be treated.  Keep all doctor visits. GET HELP RIGHT AWAY IF:   You have a fever.  You have more belly (abdominal) or lower belly pain.  You have chills.  You have pain when you pee (urinate).  You are not better after 72 hours.  You have more fluid (discharge) coming from your vagina or fluid that is not normal.  You need pain medicine from your doctor.  You throw up (vomit).  You cannot take your medicines.  Your partner has a sexually transmitted disease (STD). MAKE SURE YOU:   Understand these instructions.  Will watch your condition.  Will get help right away if you are not doing well or get worse. Document Released: 05/07/2008 Document Revised: 06/05/2012 Document Reviewed: 02/04/2011 [facility]ExitCare Patient Information 2014 [place]ExitCare, [place]LLC.

## 2013-08-07 NOTE — MAU Provider Note (Signed)

## 2013-11-07 ENCOUNTER — Ambulatory Visit (HOSPITAL_COMMUNITY)
Admission: RE | Admit: 2013-11-07 | Discharge: 2013-11-07 | Disposition: A | Discharge status: Home / Self Care | Payer: BC Managed Care – PPO | Source: Ambulatory Visit | Attending: Obstetrics & Gynecology | Admitting: Obstetrics & Gynecology

## 2013-11-07 DIAGNOSIS — N739 Female pelvic inflammatory disease, unspecified: Secondary | ICD-10-CM | POA: Diagnosis present

## 2013-11-07 DIAGNOSIS — R9389 Abnormal findings on diagnostic imaging of other specified body structures: Secondary | ICD-10-CM

## 2013-12-24 ENCOUNTER — Encounter (HOSPITAL_COMMUNITY): Payer: Self-pay | Admitting: *Deleted

## 2014-09-25 ENCOUNTER — Ambulatory Visit
Admission: RE | Admit: 2014-09-25 | Discharge: 2014-09-25 | Disposition: A | Discharge status: Home / Self Care | Payer: 59 | Source: Ambulatory Visit | Attending: Family Medicine | Admitting: Family Medicine

## 2014-09-25 ENCOUNTER — Other Ambulatory Visit: Payer: Self-pay | Admitting: Family Medicine

## 2014-09-25 DIAGNOSIS — M546 Pain in thoracic spine: Secondary | ICD-10-CM

## 2015-12-30 ENCOUNTER — Encounter (INDEPENDENT_AMBULATORY_CARE_PROVIDER_SITE_OTHER): Payer: Self-pay | Admitting: Orthopaedic Surgery

## 2015-12-30 ENCOUNTER — Ambulatory Visit (INDEPENDENT_AMBULATORY_CARE_PROVIDER_SITE_OTHER): Payer: BLUE CROSS/BLUE SHIELD | Admitting: Orthopaedic Surgery

## 2015-12-30 VITALS — Ht 70.0 in | Wt 117.0 lb

## 2015-12-30 DIAGNOSIS — M41124 Adolescent idiopathic scoliosis, thoracic region: Secondary | ICD-10-CM | POA: Insufficient documentation

## 2015-12-30 NOTE — Progress Notes (Signed)
Office Visit Note   Patient: Betty Roberts           Date of Birth: [birth year]           MRN: [id number] Visit Date: 12/30/2015              Requested by: No referring provider defined for this encounter. PCP: No PCP Per Patient   Assessment & Plan: Visit Diagnoses:  1. Adolescent idiopathic scoliosis of thoracic region     Plan:  - 2016 xrays reviewed and shows right sided 17 degree thoracic curve - recommend PT for core, balance, flexibility - if numbness worsens, should come back, will consider MRI  Follow-Up Instructions: Return if symptoms worsen or fail to improve.   Orders:  Orders Placed This Encounter  Procedures  . Ambulatory referral to Physical Therapy   No orders of the defined types were placed in this encounter.     Procedures: No procedures performed   Clinical Data: No additional findings.   Subjective: Chief Complaint  Patient presents with  . Middle Back - Pain    UPPER BACK PAIN     25 yo healthy female with right scapular and occasional right rib pain since 2016.  Denies any injuries.  Occasional numbness in both feet.  Discomfort doesn't radiate.  Denies any weakness or numbness.  Doesn't have back pain.  Denies any incontinence.    Review of Systems  Constitutional: Negative.   HENT: Negative.   Eyes: Negative.   Respiratory: Negative.   Cardiovascular: Negative.   Endocrine: Negative.   Musculoskeletal: Negative.   Neurological: Negative.   Hematological: Negative.   Psychiatric/Behavioral: Negative.   All other systems reviewed and are negative.    Objective: Vital Signs: Ht 5\' 10"  (1.778 m)   Wt 117 lb (53.1 kg)   BMI 16.79 kg/m   Physical Exam  Constitutional: She is oriented to person, place, and time. She appears well-developed and well-nourished.  HENT:  Head: Atraumatic.  Eyes: EOM are normal.  Neck: Neck supple.  Cardiovascular: Intact distal pulses.   Pulmonary/Chest: Effort normal.  Abdominal: Soft.   Neurological: She is alert and oriented to person, place, and time.  Skin: Skin is warm. Capillary refill takes less than 2 seconds.  Psychiatric: She has a normal mood and affect. Her behavior is normal. Judgment and thought content normal.  Nursing note and vitals reviewed.   Back Exam   Range of Motion  The patient has normal back ROM.  Tests  Straight leg raise right: negative Straight leg raise left: negative  Reflexes  Patellar: 1/4 Achilles: 1/4  Other  Toe Walk: normal Heel Walk: normal Sensation: normal Gait: normal   Comments:  Periscapular discomfort with palpation      Specialty Comments:  No specialty comments available.  Imaging: No results found.   PMFS History: Patient Active Problem List   Diagnosis Date Noted  . Adolescent idiopathic scoliosis of thoracic region 12/30/2015   Past Medical History:  Diagnosis Date  . Asthma   . Ovarian cyst   . Pelvic inflammatory disease     No family history on file.  No past surgical history on file. Social History   Occupational History  . Not on file.   Social History Main Topics  . Smoking status: Current Every Day Smoker    Types: Cigars  . Smokeless tobacco: Not on file  . Alcohol use No  . Drug use: No  . Sexual activity: Yes  Birth control/ protection: None

## 2016-01-08 ENCOUNTER — Encounter: Payer: Self-pay | Admitting: Physical Therapy

## 2016-01-08 ENCOUNTER — Ambulatory Visit: Payer: BLUE CROSS/BLUE SHIELD | Attending: Orthopaedic Surgery | Admitting: Physical Therapy

## 2016-01-08 DIAGNOSIS — M6281 Muscle weakness (generalized): Secondary | ICD-10-CM | POA: Diagnosis present

## 2016-01-08 DIAGNOSIS — M4124 Other idiopathic scoliosis, thoracic region: Secondary | ICD-10-CM | POA: Insufficient documentation

## 2016-01-08 DIAGNOSIS — M546 Pain in thoracic spine: Secondary | ICD-10-CM

## 2016-01-09 NOTE — Therapy (Addendum)
[place], [place], [address] Phone: [phone]   Fax:  [phone]  Physical Therapy Evaluation  Patient Details  Name: Betty Roberts MRN: [id number] Date of Birth: Jul 15, 1990 Referring Provider: Dr Frankey Shown   Encounter Date: 01/08/2016      PT End of Session - 01/09/16 0934    Visit Number 1   Number of Visits 12   Date for PT Re-Evaluation 02/20/16   Authorization Type BCBS   PT Start Time 1634   PT Stop Time 1719   PT Time Calculation (min) 45 min   Activity Tolerance Patient tolerated treatment well   Behavior During Therapy [facility] for tasks assessed/performed      Past Medical History:  Diagnosis Date  . Asthma   . Ovarian cyst   . Pelvic inflammatory disease     History reviewed. No pertinent surgical history.  There were no vitals filed for this visit.       Subjective Assessment - 01/08/16 1638    Subjective Patient has a long history of throacic pain on the right side. She has scoliosis on the right. Over time she feels like she is starting to have scapular winging on the right side. She is having increased  pain with use of her right arm. She sits at a desk all day and needs to do frequent turning.     Limitations Lifting   How long can you sit comfortably? Increased pain when she sits at her desk for a long period of time    How long can you stand comfortably? no limit   How long can you walk comfortably? no limit   Diagnostic tests Thoracic MRI: thoracic scoliosis   Patient Stated Goals zto have less pain    Currently in Pain? Yes   Pain Score 6    Pain Location Thoracic   Pain Orientation Right   Pain Descriptors / Indicators Aching   Pain Type Acute pain   Pain Onset More than a month ago   Pain Frequency Intermittent   Aggravating Factors  using her right arm.    Pain Relieving Factors rest, heat,    Effect of Pain on Daily Activities difficulty using her right arm  for daily activity    Multiple Pain Sites No            [facility] PT Assessment - 01/09/16 0001      Assessment   Medical Diagnosis right sided thoracic pain    Referring Provider Dr Frankey Shown    Onset Date/Surgical Date --  Long term pain but winging noticed 2 months prior    Hand Dominance Right   Next MD Visit None shceduled    Prior Therapy No     Precautions   Precautions None     Restrictions   Weight Bearing Restrictions No     Balance Screen   Has the patient fallen in the past 6 months No     Home Environment   Additional Comments nothing pertinent      Prior Function   Level of Independence Independent   Vocation Full time employment   Vocation Requirements Sitting at a computer    Leisure Nothing       Cognition   Overall Cognitive Status Within Functional Limits for tasks assessed   Attention Focused   Focused Attention Appears intact   Memory Appears intact   Awareness Appears intact   Problem Solving Appears intact  Observation/Other Assessments   Observations R sided scapular winging    Focus on Therapeutic Outcomes (FOTO)  Thoriacic pain      Sensation   Additional Comments No radicualr symptoms      Coordination   Gross Motor Movements are Fluid and Coordinated Yes     Posture/Postural Control   Posture Comments Rounded shoudlers      ROM / Strength   AROM / PROM / Strength AROM;PROM;Strength     AROM   Overall AROM Comments Pain with end range active shoulder motion but full motion noted      Strength   Overall Strength Comments 5/5 left shoulder strength; right mid trap 4/5 R low trap 3+/5    Strength Assessment Site Shoulder   Right/Left Shoulder Right   Right Shoulder Flexion 4+/5   Right Shoulder ABduction 4+/5     Palpation   Palpation comment Tenderness to palpation around the left scapula                    [facility] Adult PT Treatment/Exercise - 01/09/16 0001      Shoulder Exercises: Supine   Other Supine  Exercises Serratus punch 1lb 2x10;      Shoulder Exercises: Standing   Other Standing Exercises towel v wall 10x 4 way; push up plus explained technique for progression from serratus punch; scpaualr retraction and scapular extension 2x10                 PT Education - 01/09/16 0928    Education provided Yes   Education Details HEP, education on symptom mangement and improtance of strethcing with scoliosis   Person(s) Educated Patient   Methods Explanation   Comprehension Verbalized understanding;Returned demonstration          PT Short Term Goals - 01/09/16 0946      PT SHORT TERM GOAL #1   Title Patient will demsottrate full active pain free right shoulder flexion    Time 3   Period Weeks   Status New     PT SHORT TERM GOAL #2   Title Patient will increase right mid trap and lower trap strength to 5/5    Time 3   Period Weeks   Status New     PT SHORT TERM GOAL #3   Title Patient will be independent with initial HEP for strengthening    Time 3   Period Weeks           PT Long Term Goals - 01/09/16 3016      PT LONG TERM GOAL #1   Title Patient will perfrom work tasks for 8 hours without report of increased pain    Time 6   Period Weeks   Status New     PT LONG TERM GOAL #2   Title Patient will reach into a cabinet overhead with right arm to grab an object without pain    Time 6   Period Weeks   Status New     PT LONG TERM GOAL #3   Title Patient will be indepdent with HEP for posture and scoliosis mangement    Time 6   Period Weeks   Status New               Plan - 01/09/16 0109    Clinical Impression Statement Patient is a 25 year old female with throacic spine pain on the right. She has a right convexity scoliosis. She is beggining to develop scapular wigning  on the right. She has decreased scapular stability on the right. She was given a scapualr strengthening program as well as a stretching program to help reduce the progression of  her scoliosis. She would benefit from further skilled therapy to improvew her strength. She has a high co-pay so her visits will be limited. She was seen for a low complexity evaluation.    Rehab Potential Good   PT Frequency 2x / week   PT Duration 8 weeks   PT Treatment/Interventions ADLs/Self Care Home Management;Electrical Stimulation;Cryotherapy;Gait training;Stair training;Ultrasound;Traction;Moist Heat;Iontophoresis 43m/ml Dexamethasone;Functional mobility training;Therapeutic activities;Therapeutic exercise;Neuromuscular re-education;Manual lymph drainage;Manual techniques;Patient/family education;Dry needling;Passive range of motion;Splinting;Taping   PT Next Visit Plan continue to progress scapualr strethneing, consider wall eclock, wall walk, quadruped alternating UE lE, bilateral ER, Consider manual therapy if the patient is having muscle spasms, lateral prayer stretch    PT Home Exercise Plan serratus punch, push-up plus, towell v wall, scapr retraction, scap extension; thoracic stretch, posterior capsule stretch   Consulted and Agree with Plan of Care Patient      Patient will benefit from skilled therapeutic intervention in order to improve the following deficits and impairments:  Decreased activity tolerance, Decreased strength, Postural dysfunction, Pain, Impaired UE functional use  Visit Diagnosis: Other idiopathic scoliosis, thoracic region - Plan: PT plan of care cert/re-cert  Pain in thoracic spine - Plan: PT plan of care cert/re-cert  Muscle weakness (generalized) - Plan: PT plan of care cert/re-cert   PHYSICAL THERAPY DISCHARGE SUMMARY  Visits from Start of Care:1  Current functional level related to goals / functional outcomes: Limited visits 2nd to co-pay. Unknown.   Remaining deficits: Unknown   Education / Equipment: Unknown  Plan: Patient agrees to discharge.  Patient goals were partially met. Patient is being discharged due to not returning since the last  visit.  ?????       Problem List Patient Active Problem List   Diagnosis Date Noted  . Adolescent idiopathic scoliosis of thoracic region 12/30/2015    DCarney LivingPT DPT  01/09/2016, 10:05 AM  [place] [address]Phone: [phone]  Fax:  [phone] Name: Betty CIAMPAMRN: [id number] of Birth: 214-Jan-1992

## 2016-06-03 ENCOUNTER — Other Ambulatory Visit (HOSPITAL_COMMUNITY)
Admission: RE | Admit: 2016-06-03 | Discharge: 2016-06-03 | Disposition: A | Discharge status: Home / Self Care | Payer: BLUE CROSS/BLUE SHIELD | Source: Ambulatory Visit | Attending: Family Medicine | Admitting: Family Medicine

## 2016-06-03 ENCOUNTER — Other Ambulatory Visit: Payer: Self-pay | Admitting: Family Medicine

## 2016-06-03 DIAGNOSIS — Z124 Encounter for screening for malignant neoplasm of cervix: Secondary | ICD-10-CM | POA: Diagnosis not present

## 2016-06-07 LAB — CYTOLOGY - PAP: Diagnosis: NEGATIVE

## 2017-09-16 DIAGNOSIS — K0510 Chronic gingivitis, plaque induced: Secondary | ICD-10-CM | POA: Diagnosis not present

## 2018-01-11 ENCOUNTER — Ambulatory Visit (HOSPITAL_COMMUNITY)
Admission: EM | Admit: 2018-01-11 | Discharge: 2018-01-11 | Disposition: A | Discharge status: Home / Self Care | Payer: 59 | Attending: Family Medicine | Admitting: Family Medicine

## 2018-01-11 ENCOUNTER — Encounter (HOSPITAL_COMMUNITY): Payer: Self-pay | Admitting: Emergency Medicine

## 2018-01-11 DIAGNOSIS — F17290 Nicotine dependence, other tobacco product, uncomplicated: Secondary | ICD-10-CM | POA: Insufficient documentation

## 2018-01-11 DIAGNOSIS — Z91013 Allergy to seafood: Secondary | ICD-10-CM | POA: Diagnosis not present

## 2018-01-11 DIAGNOSIS — N3090 Cystitis, unspecified without hematuria: Secondary | ICD-10-CM | POA: Diagnosis not present

## 2018-01-11 DIAGNOSIS — R350 Frequency of micturition: Secondary | ICD-10-CM | POA: Diagnosis present

## 2018-01-11 LAB — POCT URINALYSIS DIP (DEVICE)
BILIRUBIN URINE: NEGATIVE
GLUCOSE, UA: NEGATIVE mg/dL
KETONES UR: NEGATIVE mg/dL
Nitrite: NEGATIVE
Protein, ur: NEGATIVE mg/dL
Specific Gravity, Urine: 1.01 (ref 1.005–1.030)
Urobilinogen, UA: 0.2 mg/dL (ref 0.0–1.0)
pH: 7 (ref 5.0–8.0)

## 2018-01-11 MED ORDER — CEPHALEXIN 500 MG PO CAPS: 500.0000 mg | ORAL_CAPSULE | Freq: Two times a day (BID) | ORAL | 0 refills | Status: AC

## 2018-01-11 NOTE — ED Provider Notes (Addendum)
[facility]    ASSESSMENT & PLAN:  1. Cystitis   She has no concern for STI. Declines testing. No signs of pyelonephritis.  Meds ordered this encounter  Medications  . cephALEXin (KEFLEX) 500 MG capsule    Sig: Take 1 capsule (500 mg total) by mouth 2 (two) times daily.    Dispense:  10 capsule    Refill:  0   Urine culture sent. Will notify patient when results available. Will follow up with her PCP or here if not showing improvement over the next 48 hours, sooner if needed.  Outlined signs and symptoms indicating need for more acute intervention. Patient verbalized understanding. After Visit Summary given.  SUBJECTIVE:  Betty Roberts is a 27 y.o. female who complains of urinary frequency, urgency and dysuria noticed this morning. No flank pain, fever, chills, abnormal vaginal discharge or bleeding. Hematuria: not present. Normal PO intake. No abdominal pain. No self treatment. Ambulatory without difficulty. Sexually active with her husband only. LMP: Patient's last menstrual period was 01/08/2018.  ROS: As in HPI.  OBJECTIVE:  Vitals:   01/11/18 1601  BP: 122/76  Pulse: 87  Resp: 16  Temp: 98.3 F (36.8 C)  SpO2: 100%   Appears well, in no apparent distress. Abdomen is soft without tenderness, guarding, mass, rebound or organomegaly. No CVA tenderness or inguinal adenopathy noted.  Labs Reviewed  POCT URINALYSIS DIP (DEVICE) - Abnormal; Notable for the following components:      Result Value   Hgb urine dipstick LARGE (*)    Leukocytes, UA TRACE (*)    All other components within normal limits  URINE CULTURE    Allergies  Allergen Reactions  . Shellfish Allergy     Past Medical History:  Diagnosis Date  . Asthma   . Ovarian cyst   . Pelvic inflammatory disease    Social History   Socioeconomic History  . Marital status: Married    Spouse name: Not on file  . Number of children: Not on file  . Years of education: Not on file  .  Highest education level: Not on file  Occupational History  . Not on file  Social Needs  . Financial resource strain: Not on file  . Food insecurity:    Worry: Not on file    Inability: Not on file  . Transportation needs:    Medical: Not on file    Non-medical: Not on file  Tobacco Use  . Smoking status: Current Every Day Smoker    Types: Cigars  Substance and Sexual Activity  . Alcohol use: No  . Drug use: No  . Sexual activity: Yes    Birth control/protection: None  Lifestyle  . Physical activity:    Days per week: Not on file    Minutes per session: Not on file  . Stress: Not on file  Relationships  . Social connections:    Talks on phone: Not on file    Gets together: Not on file    Attends religious service: Not on file    Active member of club or organization: Not on file    Attends meetings of clubs or organizations: Not on file    Relationship status: Not on file  . Intimate partner violence:    Fear of current or ex partner: Not on file    Emotionally abused: Not on file    Physically abused: Not on file    Forced sexual activity: Not on file  Other Topics  Concern  . Not on file  Social History Narrative  . Not on file   No family history on file.     Mardella LaymanHagler, Eriona Kinchen, MD 01/11/18 [id number]    Mardella LaymanHagler, Eira Alpert, MD 01/11/18 [phone]

## 2018-01-11 NOTE — ED Triage Notes (Signed)
Pt c/o urinary frequency, pressure, blood in her urine since this morning. Denies pain at this time.

## 2018-01-14 LAB — URINE CULTURE

## 2018-01-18 ENCOUNTER — Telehealth (HOSPITAL_COMMUNITY): Payer: Self-pay | Admitting: Emergency Medicine

## 2018-01-18 MED ORDER — FLUCONAZOLE 150 MG PO TABS: 150.0000 mg | ORAL_TABLET | Freq: Once | ORAL | 0 refills | Status: AC

## 2018-01-18 NOTE — Telephone Encounter (Signed)
Pt left voicemail returning our call. Called patient back, let her know her results. Pt states after finishing the antibiotic she feels vaginal irritation which feels like a yeast infection. Per Dr. Tracie HarrierHagler, send in diflucan to perferred pharmacy. Pt agreeable to plan. All questions answered

## 2018-02-20 ENCOUNTER — Encounter (HOSPITAL_COMMUNITY): Payer: Self-pay

## 2018-02-20 ENCOUNTER — Other Ambulatory Visit: Payer: Self-pay

## 2018-02-20 ENCOUNTER — Emergency Department (HOSPITAL_COMMUNITY)
Admission: EM | Admit: 2018-02-20 | Discharge: 2018-02-21 | Disposition: A | Discharge status: Home / Self Care | Payer: 59 | Attending: Emergency Medicine | Admitting: Emergency Medicine

## 2018-02-20 ENCOUNTER — Emergency Department (HOSPITAL_COMMUNITY): Payer: 59

## 2018-02-20 DIAGNOSIS — S0181XA Laceration without foreign body of other part of head, initial encounter: Secondary | ICD-10-CM | POA: Insufficient documentation

## 2018-02-20 DIAGNOSIS — F17290 Nicotine dependence, other tobacco product, uncomplicated: Secondary | ICD-10-CM | POA: Diagnosis not present

## 2018-02-20 DIAGNOSIS — S0990XA Unspecified injury of head, initial encounter: Secondary | ICD-10-CM | POA: Diagnosis present

## 2018-02-20 DIAGNOSIS — J45909 Unspecified asthma, uncomplicated: Secondary | ICD-10-CM | POA: Insufficient documentation

## 2018-02-20 DIAGNOSIS — R55 Syncope and collapse: Secondary | ICD-10-CM | POA: Diagnosis not present

## 2018-02-20 DIAGNOSIS — Y939 Activity, unspecified: Secondary | ICD-10-CM | POA: Insufficient documentation

## 2018-02-20 DIAGNOSIS — N83209 Unspecified ovarian cyst, unspecified side: Secondary | ICD-10-CM | POA: Insufficient documentation

## 2018-02-20 DIAGNOSIS — Z23 Encounter for immunization: Secondary | ICD-10-CM | POA: Insufficient documentation

## 2018-02-20 DIAGNOSIS — Y92009 Unspecified place in unspecified non-institutional (private) residence as the place of occurrence of the external cause: Secondary | ICD-10-CM | POA: Diagnosis not present

## 2018-02-20 DIAGNOSIS — W1789XA Other fall from one level to another, initial encounter: Secondary | ICD-10-CM | POA: Insufficient documentation

## 2018-02-20 DIAGNOSIS — Y999 Unspecified external cause status: Secondary | ICD-10-CM | POA: Diagnosis not present

## 2018-02-20 DIAGNOSIS — E876 Hypokalemia: Secondary | ICD-10-CM | POA: Diagnosis not present

## 2018-02-20 LAB — I-STAT CHEM 8, ED
BUN: 19 mg/dL (ref 6–20)
Calcium, Ion: 1.13 mmol/L — ABNORMAL LOW (ref 1.15–1.40)
Chloride: 104 mmol/L (ref 98–111)
Creatinine, Ser: 0.8 mg/dL (ref 0.44–1.00)
Glucose, Bld: 116 mg/dL — ABNORMAL HIGH (ref 70–99)
HCT: 32 % — ABNORMAL LOW (ref 36.0–46.0)
Hemoglobin: 10.9 g/dL — ABNORMAL LOW (ref 12.0–15.0)
Potassium: 2.9 mmol/L — ABNORMAL LOW (ref 3.5–5.1)
Sodium: 140 mmol/L (ref 135–145)
TCO2: 24 mmol/L (ref 22–32)

## 2018-02-20 LAB — CBC
HCT: 36.1 % (ref 36.0–46.0)
HEMOGLOBIN: 12.4 g/dL (ref 12.0–15.0)
MCH: 31.8 pg (ref 26.0–34.0)
MCHC: 34.3 g/dL (ref 30.0–36.0)
MCV: 92.6 fL (ref 80.0–100.0)
Platelets: 254 10*3/uL (ref 150–400)
RBC: 3.9 MIL/uL (ref 3.87–5.11)
RDW: 11.9 % (ref 11.5–15.5)
WBC: 8.5 10*3/uL (ref 4.0–10.5)
nRBC: 0 % (ref 0.0–0.2)

## 2018-02-20 LAB — URINALYSIS, ROUTINE W REFLEX MICROSCOPIC
Bilirubin Urine: NEGATIVE
GLUCOSE, UA: NEGATIVE mg/dL
KETONES UR: 5 mg/dL — AB
LEUKOCYTES UA: NEGATIVE
NITRITE: NEGATIVE
PROTEIN: 30 mg/dL — AB
Specific Gravity, Urine: 1.028 (ref 1.005–1.030)
pH: 5 (ref 5.0–8.0)

## 2018-02-20 LAB — BASIC METABOLIC PANEL
Anion gap: 9 (ref 5–15)
BUN: 18 mg/dL (ref 6–20)
CHLORIDE: 104 mmol/L (ref 98–111)
CO2: 24 mmol/L (ref 22–32)
Calcium: 9.1 mg/dL (ref 8.9–10.3)
Creatinine, Ser: 0.96 mg/dL (ref 0.44–1.00)
GFR calc non Af Amer: >60 mL/min (ref >60)
Glucose, Bld: 163 mg/dL — ABNORMAL HIGH (ref 70–99)
POTASSIUM: 2.5 mmol/L — AB (ref 3.5–5.1)
Sodium: 137 mmol/L (ref 135–145)

## 2018-02-20 LAB — I-STAT BETA HCG BLOOD, ED (MC, WL, AP ONLY): I-stat hCG, quantitative: <5 m[IU]/mL (ref <5)

## 2018-02-20 MED ORDER — SODIUM CHLORIDE 0.9 % IV BOLUS
1000.0000 mL | Freq: Once | INTRAVENOUS | Status: AC
  Administered 2018-02-20: 1000 mL via INTRAVENOUS

## 2018-02-20 MED ORDER — LIDOCAINE-EPINEPHRINE (PF) 2 %-1:200000 IJ SOLN
20.0000 mL | Freq: Once | INTRAMUSCULAR | Status: AC
  Administered 2018-02-20: 20 mL
  Filled 2018-02-20: qty 20

## 2018-02-20 MED ORDER — BACITRACIN ZINC 500 UNIT/GM EX OINT
1.0000 "application " | TOPICAL_OINTMENT | Freq: Two times a day (BID) | CUTANEOUS | Status: DC
  Administered 2018-02-21: 1 via TOPICAL
  Filled 2018-02-20: qty 0.9

## 2018-02-20 MED ORDER — TETANUS-DIPHTH-ACELL PERTUSSIS 5-2.5-18.5 LF-MCG/0.5 IM SUSP
0.5000 mL | Freq: Once | INTRAMUSCULAR | Status: AC
  Administered 2018-02-21: 0.5 mL via INTRAMUSCULAR
  Filled 2018-02-20: qty 0.5

## 2018-02-20 NOTE — ED Provider Notes (Signed)
[facility] Provider Note   CSN: [id number] Arrival date & time: 02/20/18  2057     History   Chief Complaint Chief Complaint  Patient presents with  . Loss of Consciousness    HPI Betty Roberts is a 27 y.o. female.  Patient presents to the emergency department with a chief complaint of syncope.  She states that she was at home relaxing after work, when she felt like she needed to use the bathroom, she stood up suddenly and felt dizzy, and passed out.  She states that she has felt like this at times in the past, but has been able to sit down or drink some water or rest and the symptoms have subsided.  She states that this time it happened too fast.  She hit her head on the table, and did sustain a laceration to the center of her forehead.  She is treated this with gauze.  She denies any pain or difficulty moving her neck.  She denies any chest pain, palpitations, shortness of breath, nausea, vomiting, or diarrhea.  He has not taken anything for her symptoms.  She complains of mild headache, which is improving.  The history is provided by the patient. No language interpreter was used.    Past Medical History:  Diagnosis Date  . Asthma   . Ovarian cyst   . Pelvic inflammatory disease     Patient Active Problem List   Diagnosis Date Noted  . 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   OB History    Gravida  1   Para      Term      Preterm      AB  1   Living        SAB  1   TAB      Ectopic      Multiple      Live Births               Home Medications    Prior to Admission medications   Medication Sig Start Date End Date Taking? Authorizing Provider  cephALEXin (KEFLEX) 500 MG capsule Take 1 capsule (500 mg total) by mouth 2 (two) times daily. 01/11/18   Mardella LaymanHagler, Brian, MD    Family History No family history on file.  Social History Social  History   Tobacco Use  . Smoking status: Current Every Day Smoker    Types: Cigars  . Smokeless tobacco: Never Used  Substance Use Topics  . Alcohol use: No  . Drug use: No     Allergies   Shellfish allergy   Review of Systems Review of Systems  All other systems reviewed and are negative.    Physical Exam Updated Vital Signs BP 122/76 (BP Location: Right Arm)   Pulse 86   Temp 98.1 F (36.7 C) (Oral)   Resp 14   Ht 5\' 10"  (1.778 m)   Wt 51.7 kg   SpO2 100%   BMI 16.36 kg/m   Physical Exam Vitals signs and nursing note reviewed.  Constitutional:      Appearance: She is well-developed.  HENT:     Head: Normocephalic and atraumatic.     Comments: 1x2 cm stellate laceration to center of forehead, no FB Eyes:     Conjunctiva/sclera: Conjunctivae normal.     Pupils: Pupils are equal, round, and reactive to light.  Neck:  Musculoskeletal: Normal range of motion and neck supple.  Cardiovascular:     Rate and Rhythm: Normal rate and regular rhythm.     Heart sounds: No murmur. No friction rub. No gallop.   Pulmonary:     Effort: Pulmonary effort is normal. No respiratory distress.     Breath sounds: Normal breath sounds. No wheezing or rales.  Chest:     Chest wall: No tenderness.  Abdominal:     General: Bowel sounds are normal. There is no distension.     Palpations: Abdomen is soft. There is no mass.     Tenderness: There is no abdominal tenderness. There is no guarding or rebound.  Musculoskeletal: Normal range of motion.        General: No tenderness.  Skin:    General: Skin is warm and dry.  Neurological:     Mental Status: She is alert and oriented to person, place, and time.  Psychiatric:        Behavior: Behavior normal.        Thought Content: Thought content normal.        Judgment: Judgment normal.      ED Treatments / Results  Labs (all labs ordered are listed, but only abnormal results are displayed) Labs Reviewed  BASIC METABOLIC  PANEL - Abnormal; Notable for the following components:      Result Value   Potassium 2.5 (*)    Glucose, Bld 163 (*)    All other components within normal limits  URINALYSIS, ROUTINE W REFLEX MICROSCOPIC - Abnormal; Notable for the following components:   APPearance HAZY (*)    Hgb urine dipstick MODERATE (*)    Ketones, ur 5 (*)    Protein, ur 30 (*)    Bacteria, UA RARE (*)    All other components within normal limits  CBC  I-STAT BETA HCG BLOOD, ED ([facility], [facility], [facility] ONLY)    EKG None  Radiology Ct Head Wo Contrast  Result Date: 02/20/2018 CLINICAL DATA:  Head trauma, ataxia EXAM: CT HEAD WITHOUT CONTRAST TECHNIQUE: Contiguous axial images were obtained from the base of the skull through the vertex without intravenous contrast. COMPARISON:  None. FINDINGS: Brain: No evidence of acute infarction, hemorrhage, hydrocephalus, extra-axial collection or mass lesion/mass effect. Vascular: No hyperdense vessel or unexpected calcification. Skull: Normal. Negative for fracture or focal lesion. Sinuses/Orbits: No acute finding. Other: Left forehead laceration IMPRESSION: Negative non contrasted CT appearance of the brain Electronically Signed   By: Jasmine Pang M.D.   On: 02/20/2018 21:35    Procedures .Marland KitchenLaceration Repair Date/Time: 02/20/2018 10:50 PM Performed by: Roxy Horseman, PA-C Authorized by: Roxy Horseman, PA-C   Consent:    Consent obtained:  Verbal   Consent given by:  Patient   Risks discussed:  Infection, need for additional repair, pain, poor cosmetic result and poor wound healing   Alternatives discussed:  No treatment and delayed treatment Universal protocol:    Procedure explained and questions answered to patient or proxy's satisfaction: yes     Relevant documents present and verified: yes     Test results available and properly labeled: yes     Imaging studies available: yes     Required blood products, implants, devices, and special equipment available: yes      Site/side marked: yes     Immediately prior to procedure, a time out was called: yes     Patient identity confirmed:  Verbally with patient Anesthesia (see MAR for exact dosages):  Anesthesia method:  Local infiltration   Local anesthetic:  Lidocaine 1% WITH epi Laceration details:    Location:  Face   Face location:  Forehead   Length (cm):  2 Repair type:    Repair type:  Simple Pre-procedure details:    Preparation:  Patient was prepped and draped in usual sterile fashion Exploration:    Wound exploration: entire depth of wound probed and visualized     Wound extent: no fascia violation noted and no underlying fracture noted     Contaminated: no   Treatment:    Area cleansed with:  Saline   Amount of cleaning:  Standard   Irrigation solution:  Sterile saline   Irrigation volume:  250ml   Irrigation method:  Syringe   Visualized foreign bodies/material removed: no   Skin repair:    Repair method:  Sutures   Suture size:  6-0   Suture material:  Prolene   Number of sutures:  11 Approximation:    Approximation:  Close Post-procedure details:    Dressing:  Antibiotic ointment and adhesive bandage   Patient tolerance of procedure:  Tolerated well, no immediate complications   (including critical care time)  Medications Ordered in ED Medications  lidocaine-EPINEPHrine (XYLOCAINE W/EPI) 2 %-1:200000 (PF) injection 20 mL (has no administration in time range)     Initial Impression / Assessment and Plan / ED Course  I have reviewed the triage vital signs and the nursing notes.  Pertinent labs & imaging results that were available during my care of the patient were reviewed by me and considered in my medical decision making (see chart for details).     Patient with syncopal episode.  Likely positional/orthostatic, given that the episode occurred immediately upon going from sitting to standing.  Denies any chest pain or shortness of breath, doubt PE.  She is not hypoxic  or tachycardic.  Doubt ruptured ectopic, neg preg test, no abdominal pain, normal VS.  H/H is stable.   EKG normal.  K is low at 2.5.  Could be from hyperventilating after the syncopal episode and having had a neb immediately thereafter.  Will recheck K.  Repeated K is 2.9.  Advised patient to have this rechecked by PCP in a week.  Patient looks well.  Discussed with Dr. Rosalia Hammersay, who agrees with plan.     Final Clinical Impressions(s) / ED Diagnoses   Final diagnoses:  Syncope, unspecified syncope type  Hypokalemia  Injury of head, initial encounter    ED Discharge Orders    None       Roxy HorsemanBrowning, Aziyah Provencal, PA-C 02/21/18 0000    Margarita Grizzleay, Danielle, MD 02/22/18 [phone]

## 2018-02-20 NOTE — ED Triage Notes (Signed)
Pt was at home and became dizzy, while ambulating to there restroom pt passed out and hit her head on floor.  Lac to forehead with bleeding controlled with gauze.  No neck tenderness.  A&Ox4

## 2018-02-20 NOTE — Discharge Instructions (Addendum)
Please have the sutures removed in 5 days.  Should you choose, you can follow-up with a plastic surgeon for scar revision in the future.  Contact information has been provided.  Please have your potassium rechecked in a week.    Keep the wound clean and dry for 24 hours.

## 2019-01-24 ENCOUNTER — Other Ambulatory Visit: Payer: Self-pay | Admitting: Family Medicine

## 2019-01-24 DIAGNOSIS — N6320 Unspecified lump in the left breast, unspecified quadrant: Secondary | ICD-10-CM

## 2019-01-29 ENCOUNTER — Ambulatory Visit
Admission: RE | Admit: 2019-01-29 | Discharge: 2019-01-29 | Disposition: A | Discharge status: Home / Self Care | Payer: 59 | Source: Ambulatory Visit | Attending: Family Medicine | Admitting: Family Medicine

## 2019-01-29 ENCOUNTER — Other Ambulatory Visit: Payer: Self-pay

## 2019-01-29 DIAGNOSIS — N6320 Unspecified lump in the left breast, unspecified quadrant: Secondary | ICD-10-CM

## 2021-02-08 IMAGING — US US BREAST*L* LIMITED INC AXILLA
1 series · 8 of 8 positions shown · non-contrast
Comparison: None.

CLINICAL DATA: 28-year-old female presenting for evaluation of a
palpable lump in the left breast. Her doctor has put her on
antibiotics, and the lump is decreasing in size. In addition, the
patient states that she has had new left nipple discharge for about
2 months which is spontaneous and clear to yellow in color. It has
worst just before her menstrual cycle. She has not seen if it comes
from 1 or multiple ducts. She has not seen discharge for the past 2
days, and her menstrual cycle recently ended. No discharge has been
noted on the right. The patient has no family history of breast
cancer.

EXAM:
DIGITAL DIAGNOSTIC BILATERAL MAMMOGRAM WITH CAD AND TOMO
ULTRASOUND LEFT BREAST

[Series 1: us breast*left* limited inc axilla · 0.05mm/px · 8 of 8 slices shown]
[im 1/8]
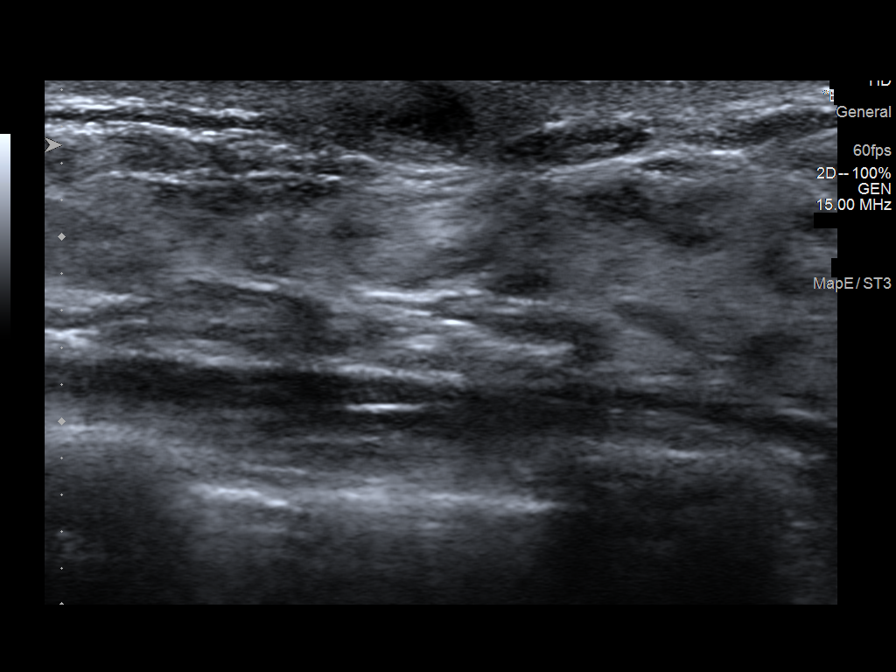
[im 2/8]
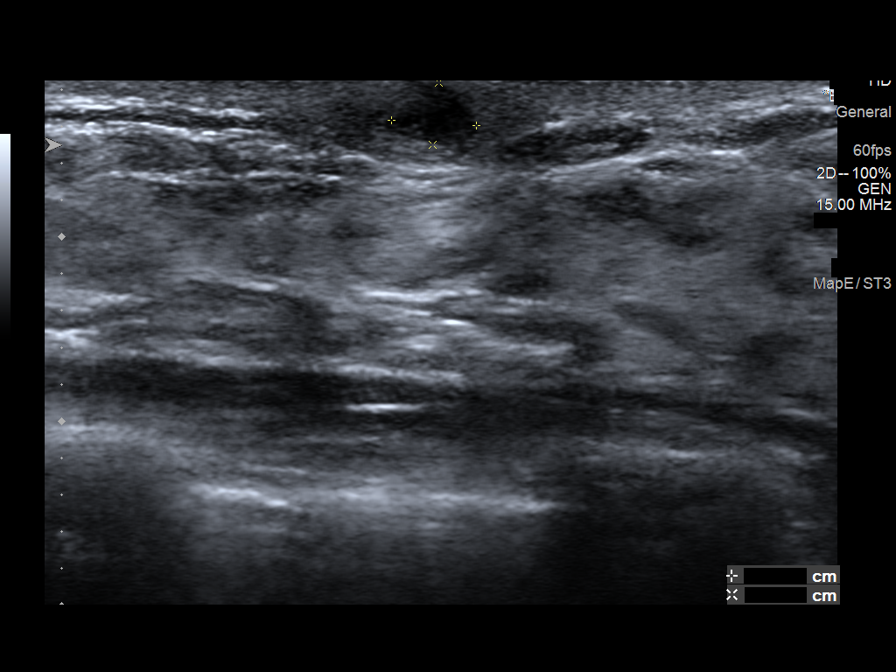
[im 3/8]
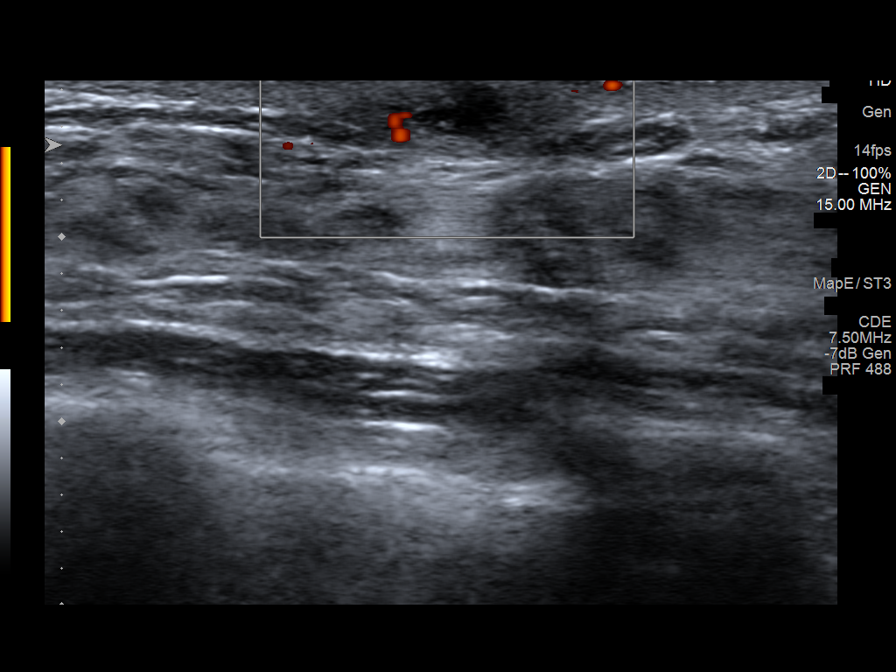
[im 4/8]
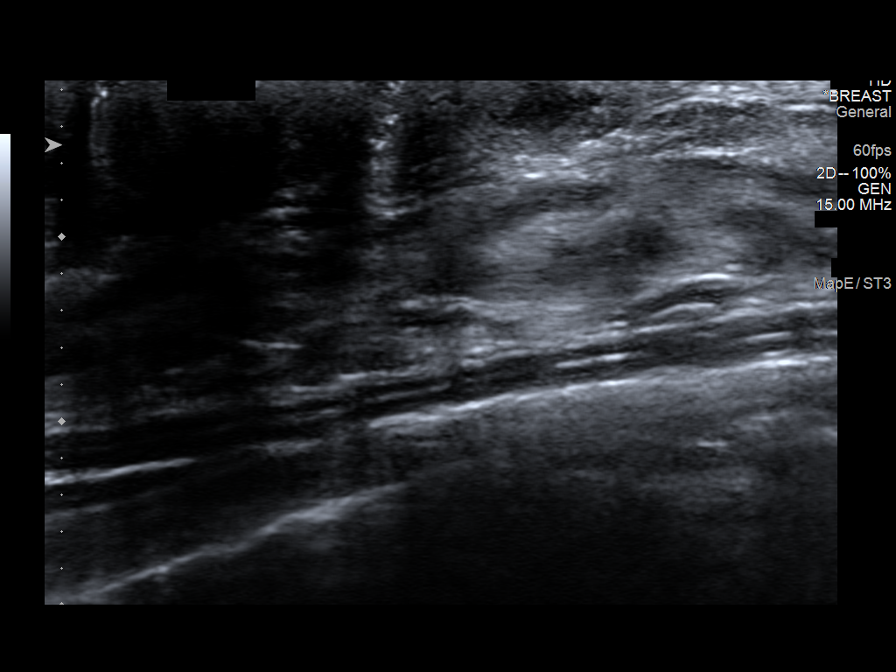
[im 5/8]
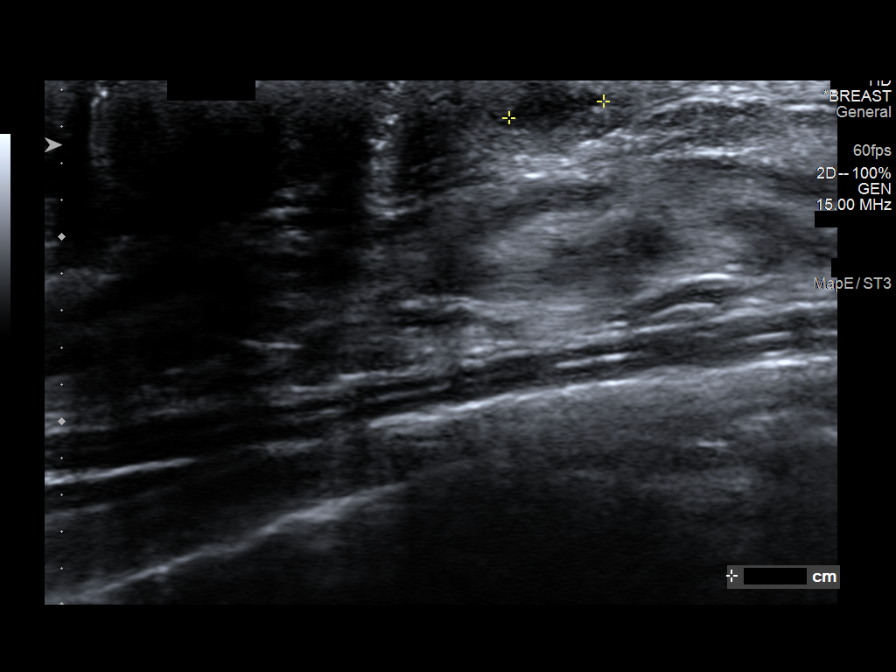
[im 6/8]
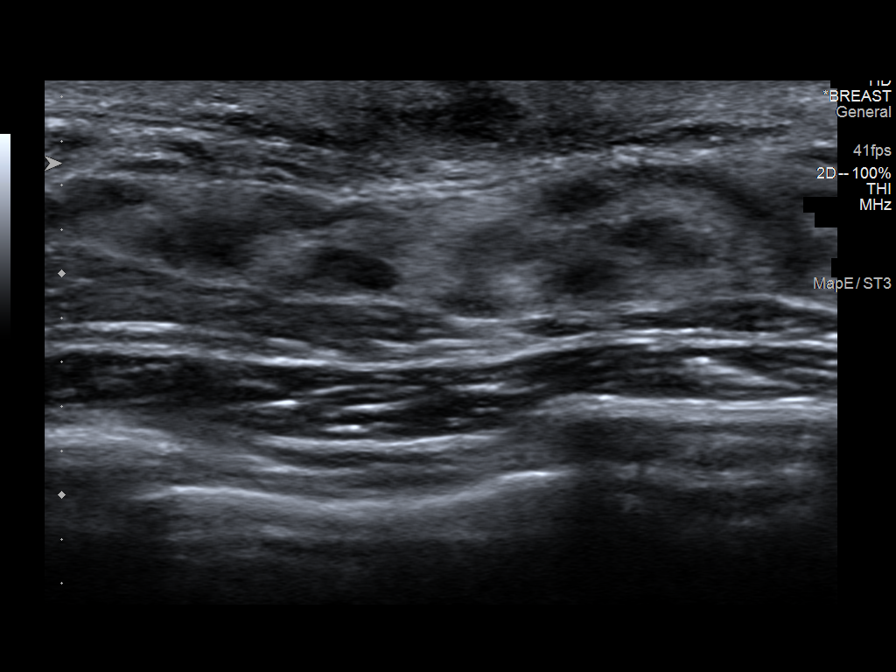
[im 7/8]
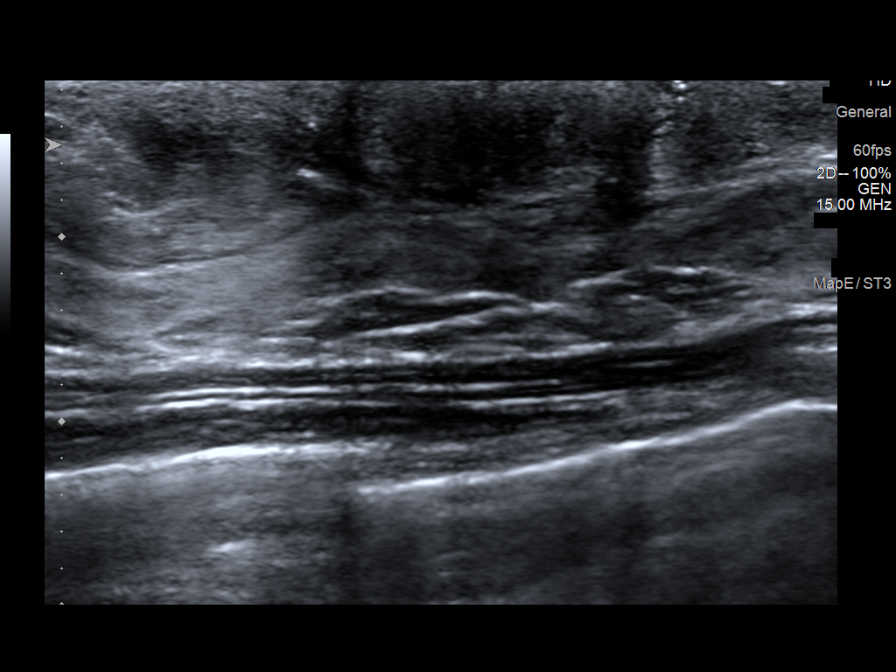
[im 8/8]
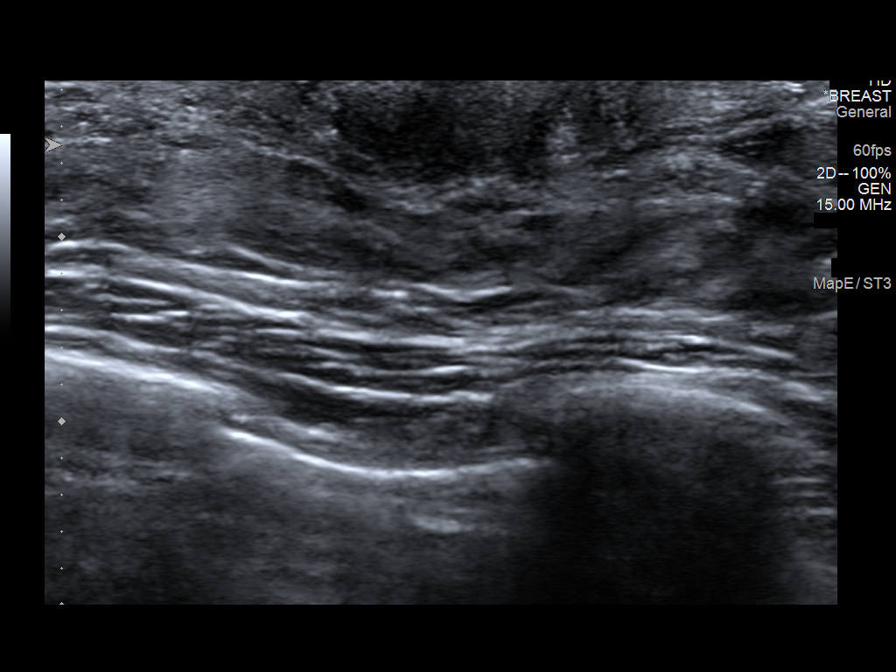

[8 of 8 positions shown; findings below may reference images not displayed]

ACR Breast Density Category d: The breast tissue is extremely dense,
which lowers the sensitivity of mammography.
FINDINGS: A BB has been placed along the inferior anterior aspect of the left
breast indicating the palpable site of concern. There are no
suspicious mammographic findings deep to the marker. No suspicious
calcifications, masses or areas of distortion are seen in the
bilateral breasts.

Mammographic images were processed with CAD.

Physical exam of the palpable site in the inferior left breast
demonstrates a small superficial palpable lump just inferior to the
left nipple. No discharge could be elicited with manual expression
on my exam.

Ultrasound targeted to the retroareolar left breast at 6 o'clock,
demonstrates a hypoechoic mass within the skin measuring 5 x 3 x 5
mm. Ultrasound of the retroareolar left breast demonstrates normal
fibroglandular tissue. No dilated ducts or intraductal masses are
identified.
IMPRESSION: 1. The palpable lump in the left areola is a benign mass within the
skin, compatible with a sebaceous cyst.

2. There are no mammographic or targeted sonographic abnormalities
in the retroareolar left breast to explain the patient's left nipple
discharge.

3.  No mammographic evidence of malignancy in the bilateral breasts.

RECOMMENDATION:
1. Clinical follow-up recommended for the palpable area in the left
areola.

2. If the left nipple discharge persists following treatment,
bilateral breast MRI with surgical consultation is recommended. If
the nipple discharge resolves following treatment, the patient
should return for screening mammography at the age of 38.

I have discussed the findings and recommendations with the patient.
If applicable, a reminder letter will be sent to the patient
regarding the next appointment.

BI-RADS CATEGORY  2: Benign.

## 2021-02-08 IMAGING — MG DIGITAL DIAGNOSTIC BILAT W/ TOMO W/ CAD
6 of 10 series · 6 of 30 positions shown · non-contrast
Comparison: None.

CLINICAL DATA: 28-year-old female presenting for evaluation of a
palpable lump in the left breast. Her doctor has put her on
antibiotics, and the lump is decreasing in size. In addition, the
patient states that she has had new left nipple discharge for about
2 months which is spontaneous and clear to yellow in color. It has
worst just before her menstrual cycle. She has not seen if it comes
from 1 or multiple ducts. She has not seen discharge for the past 2
days, and her menstrual cycle recently ended. No discharge has been
noted on the right. The patient has no family history of breast
cancer.

EXAM:
DIGITAL DIAGNOSTIC BILATERAL MAMMOGRAM WITH CAD AND TOMO
ULTRASOUND LEFT BREAST

[L CC synth-2D]
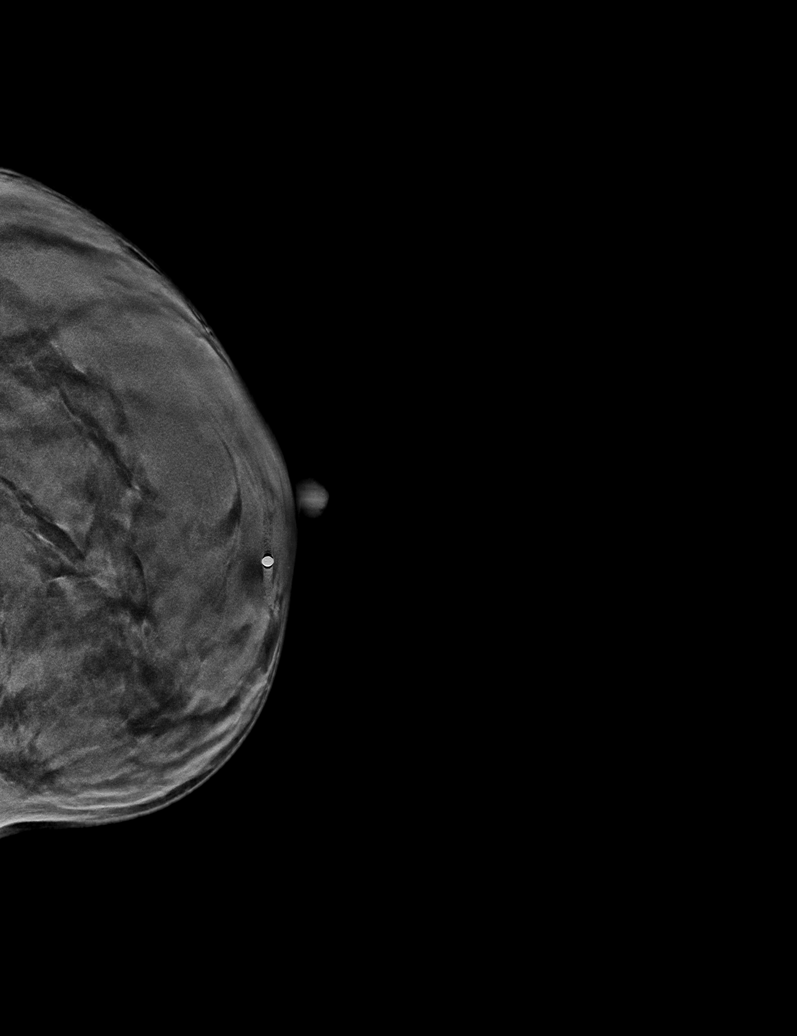

[L MLO synth-2D (1 of 2)]
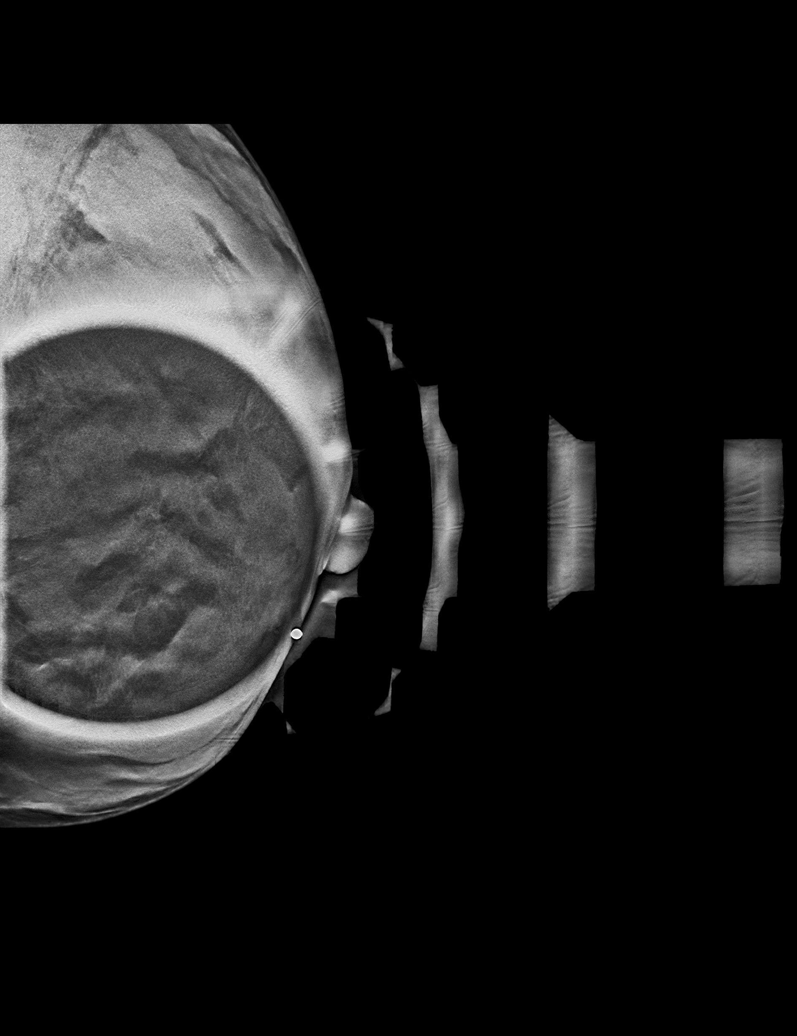

[L MLO synth-2D (2 of 2)]
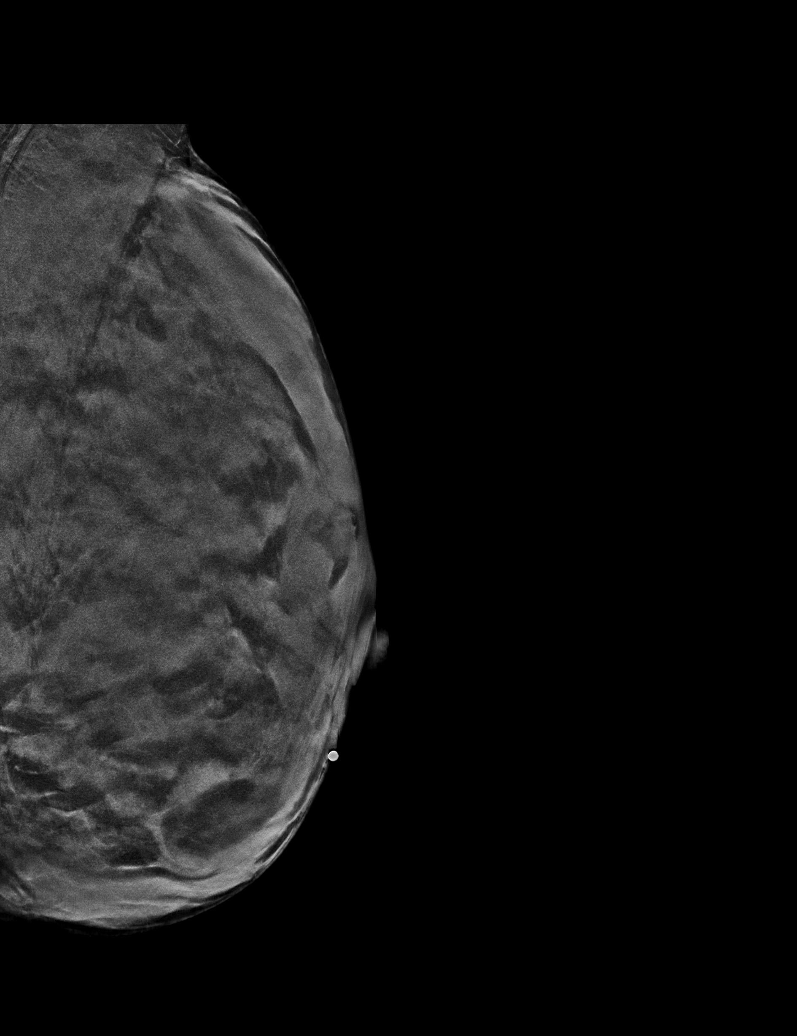

[R MLO synth-2D]
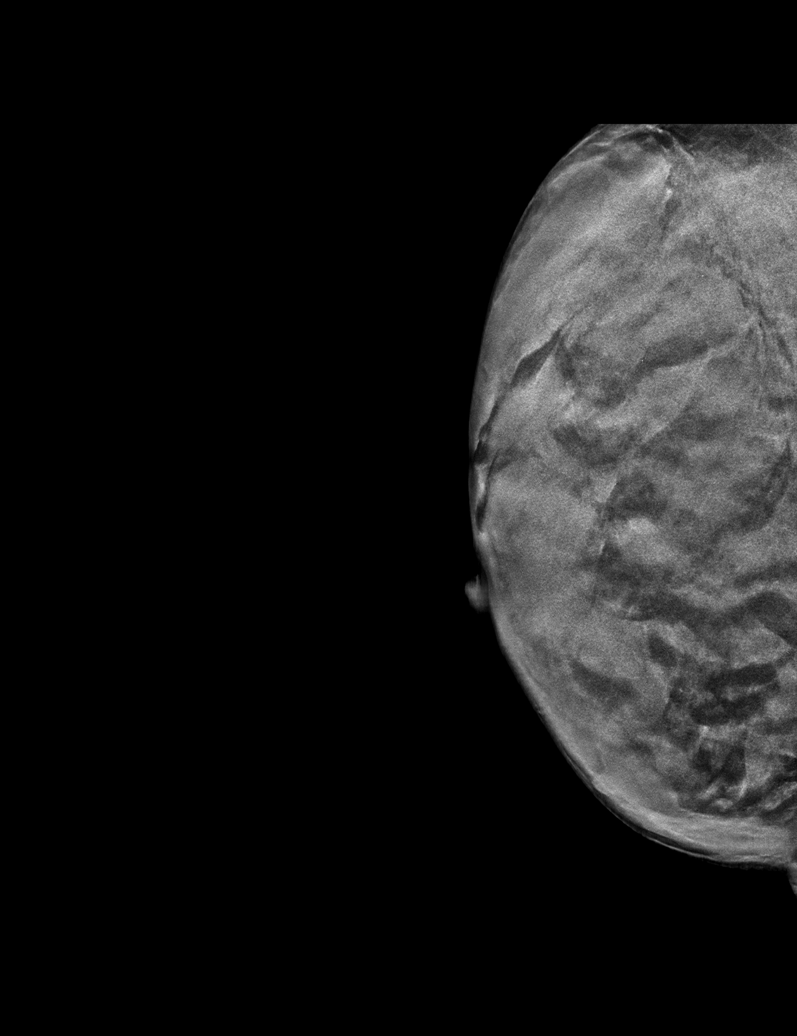

[R CC synth-2D]
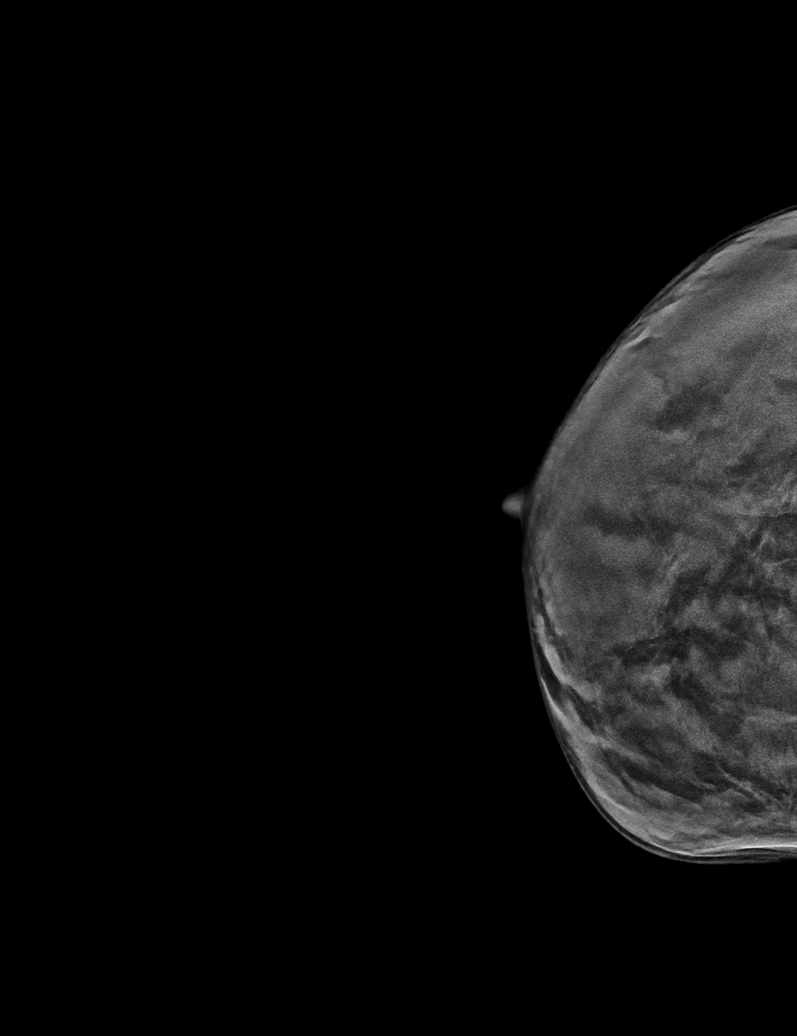

[L MLO tomo · tomo slice 27/53.0]
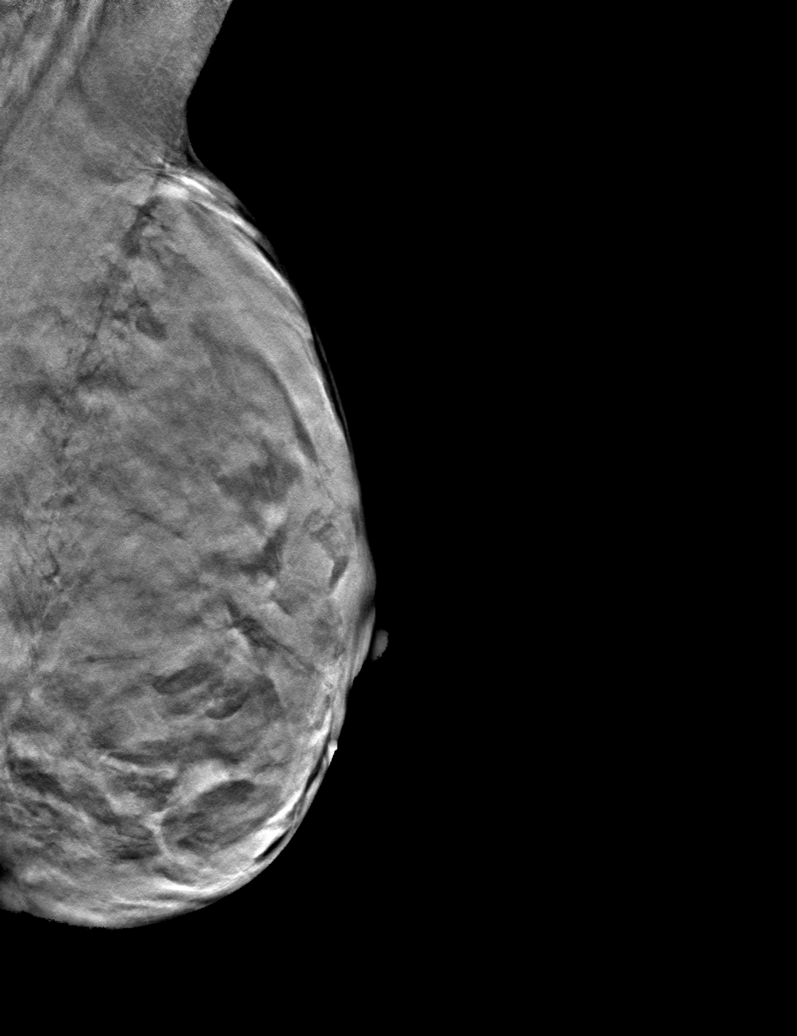

[6 of 30 positions shown; findings below may reference images not displayed]

ACR Breast Density Category d: The breast tissue is extremely dense,
which lowers the sensitivity of mammography.
FINDINGS: A BB has been placed along the inferior anterior aspect of the left
breast indicating the palpable site of concern. There are no
suspicious mammographic findings deep to the marker. No suspicious
calcifications, masses or areas of distortion are seen in the
bilateral breasts.

Mammographic images were processed with CAD.

Physical exam of the palpable site in the inferior left breast
demonstrates a small superficial palpable lump just inferior to the
left nipple. No discharge could be elicited with manual expression
on my exam.

Ultrasound targeted to the retroareolar left breast at 6 o'clock,
demonstrates a hypoechoic mass within the skin measuring 5 x 3 x 5
mm. Ultrasound of the retroareolar left breast demonstrates normal
fibroglandular tissue. No dilated ducts or intraductal masses are
identified.
IMPRESSION: 1. The palpable lump in the left areola is a benign mass within the
skin, compatible with a sebaceous cyst.

2. There are no mammographic or targeted sonographic abnormalities
in the retroareolar left breast to explain the patient's left nipple
discharge.

3.  No mammographic evidence of malignancy in the bilateral breasts.

RECOMMENDATION:
1. Clinical follow-up recommended for the palpable area in the left
areola.

2. If the left nipple discharge persists following treatment,
bilateral breast MRI with surgical consultation is recommended. If
the nipple discharge resolves following treatment, the patient
should return for screening mammography at the age of 38.

I have discussed the findings and recommendations with the patient.
If applicable, a reminder letter will be sent to the patient
regarding the next appointment.

BI-RADS CATEGORY  2: Benign.
